# Patient Record
Sex: Female | Born: 1940 | Race: Black or African American | Hispanic: No | Marital: Married | Smoking: Never smoker
Health system: Southern US, Community
[De-identification: ages and names within clinical notes are randomized; demographics above are authoritative.]

## PROBLEM LIST (undated history)

## (undated) DIAGNOSIS — E785 Hyperlipidemia, unspecified: Secondary | ICD-10-CM

## (undated) DIAGNOSIS — K219 Gastro-esophageal reflux disease without esophagitis: Secondary | ICD-10-CM

## (undated) DIAGNOSIS — I10 Essential (primary) hypertension: Secondary | ICD-10-CM

## (undated) HISTORY — PX: CHOLECYSTECTOMY: SHX55

## (undated) HISTORY — PX: KNEE SURGERY: SHX244

## (undated) HISTORY — PX: ABDOMINAL HYSTERECTOMY: SHX81

---

## 2007-03-12 ENCOUNTER — Inpatient Hospital Stay: Payer: Self-pay | Admitting: Internal Medicine

## 2007-03-12 ENCOUNTER — Other Ambulatory Visit: Payer: Self-pay

## 2007-06-26 ENCOUNTER — Ambulatory Visit: Payer: Self-pay | Admitting: Gastroenterology

## 2007-06-27 ENCOUNTER — Emergency Department: Payer: Self-pay | Admitting: Emergency Medicine

## 2007-06-27 ENCOUNTER — Other Ambulatory Visit: Payer: Self-pay

## 2009-01-23 ENCOUNTER — Emergency Department: Payer: Self-pay | Admitting: Emergency Medicine

## 2011-07-07 ENCOUNTER — Emergency Department: Payer: Self-pay | Admitting: Emergency Medicine

## 2011-07-22 ENCOUNTER — Inpatient Hospital Stay: Payer: Self-pay | Admitting: *Deleted

## 2011-08-24 ENCOUNTER — Ambulatory Visit: Payer: Self-pay | Admitting: Gastroenterology

## 2012-04-10 ENCOUNTER — Ambulatory Visit: Payer: Self-pay | Admitting: Internal Medicine

## 2012-04-18 ENCOUNTER — Ambulatory Visit: Payer: Self-pay | Admitting: Internal Medicine

## 2013-02-20 ENCOUNTER — Emergency Department: Payer: Self-pay | Admitting: Emergency Medicine

## 2013-02-20 LAB — COMPREHENSIVE METABOLIC PANEL
Albumin: 4.5 g/dL (ref 3.4–5.0)
BUN: 22 mg/dL — ABNORMAL HIGH (ref 7–18)
Bilirubin,Total: 0.4 mg/dL (ref 0.2–1.0)
Calcium, Total: 10.1 mg/dL (ref 8.5–10.1)
Chloride: 105 mmol/L (ref 98–107)
Creatinine: 1.12 mg/dL (ref 0.60–1.30)
EGFR (Non-African Amer.): 50 — ABNORMAL LOW
Potassium: 3.2 mmol/L — ABNORMAL LOW (ref 3.5–5.1)
SGPT (ALT): 32 U/L (ref 12–78)
Sodium: 139 mmol/L (ref 136–145)
Total Protein: 8.6 g/dL — ABNORMAL HIGH (ref 6.4–8.2)

## 2013-02-20 LAB — TROPONIN I: Troponin-I: <0.02 ng/mL

## 2013-02-20 LAB — CBC
HCT: 41.4 % (ref 35.0–47.0)
HGB: 14 g/dL (ref 12.0–16.0)
MCH: 30 pg (ref 26.0–34.0)
MCHC: 33.8 g/dL (ref 32.0–36.0)
MCV: 89 fL (ref 80–100)
Platelet: 363 10*3/uL (ref 150–440)
RBC: 4.67 10*6/uL (ref 3.80–5.20)
RDW: 13.3 % (ref 11.5–14.5)
WBC: 11.9 10*3/uL — ABNORMAL HIGH (ref 3.6–11.0)

## 2013-02-20 LAB — LIPASE, BLOOD: Lipase: 198 U/L (ref 73–393)

## 2013-10-28 ENCOUNTER — Ambulatory Visit: Payer: Self-pay | Admitting: Internal Medicine

## 2014-12-07 NOTE — Discharge Summary (Signed)
PATIENT NAME:  Deborah Hurst, Deborah Hurst MR#:  [id number] DATE OF BIRTH:  Jul 09, 1941  DATE OF ADMISSION:  07/22/2011 DATE OF DISCHARGE:  07/25/2011  DISCHARGE DIAGNOSES:  1. Acute pancreatitis, now resolved. 2. Asthma exacerbation.  3. Elevated liver function tests, improved, may be secondary to fatty liver.  4. Hypertension.  5. Hypokalemia, resolved.   CONSULTS: GI, Dr. Ashley Jacobs COURSE: This is a 74 year old female who has a history of hypertension and asthma. She presented with nausea, vomiting, and cough, unable to tolerate p.o. She was having high-grade fevers also.  She was admitted as acute pancreatitis with elevated liver function tests. When she came in her lipase was 1947.  She had elevated liver function tests with ALT of 584 and AST of 844. She was hypokalemic with a potassium of 3.2. Creatinine was 1.22.  Her triglycerides were normal at 134. She was admitted as acute pancreatitis and elevated liver function tests. She had a CT of the abdomen done in the Emergency Room that showed mild intrahepatic biliary distention, small left adrenal lesion. Tiny adenoma cannot be excluded. Chest x-ray was essentially negative. She had an ultrasound of the abdomen done which showed that the patient probably has fatty infiltration of the liver status post cholecystectomy. Pancreas is not visualized. She was started on IV hydration, n.p.o.  She was also started on IV Zosyn because of her fever spikes and acute pancreatitis.  She improved with IV hydration, n.p.o. Her lipase improved to 188 on 07/24/2011.  Her liver function tests have been improving as well. Her liver function at discharge: ALT 139 with AST of 60 with normal alkaline phosphatase and normal bilirubin. She had a hepatitis profile done which was negative. Hepatitis A antibody negative, hepatitis B surface antigen negative, hepatitis B core antibody negative. Influenza A and B negative. Acetaminophen level less than 2. Her urinalysis was  negative for any pyuria. Blood and urine cultures were negative. Her INR was 0.9 when she came in. When she came in her white count was slightly elevated at 15.3 but that improved the next day to 7.5. We will give her a total duration of seven days of antibiotics. She was wheezing and short of breath and coughing. She had an asthma exacerbation also.  She was started on a prednisone taper and Duo-Nebs in the hospital.  She was also started on Advair.  I will give her prescriptions for Advair, albuterol nebulizers and a prednisone taper at home. She was hypokalemic, which has resolved right now. I will decrease her hydrochlorothiazide to 25 mg once a day instead of twice a day because of her hypokalemia.  Her lipid profile shows that her LDL is 168.   I will not start on any statin at this time because of her elevated liver function tests.  If her liver function tests improve she may need a statin. With Duo-Nebs and prednisone her asthma significantly improved. Her wheezing has improved.   DISCHARGE MEDICATIONS: 1. Home medications: 2. Losartan 100 mg daily.  3. Tylenol as needed.  4. Ventolin 2 puffs as needed.  5. Advair Diskus 250/50, 1 puff b.i.d.  6. Change hydrochlorothiazide to 25 mg p.o. once daily.  7. Albuterol nebulizers 2.5 mg/ 3mL q. 6 hours as needed for shortness of breath or wheezing when not using a Ventolin inhaler. 8. Augmentin 875 mg p.o. b.i.d. for three days and prednisone taper.  DIET:  Advised a low sodium, low fat, low cholesterol diet.   CONDITION AT DISCHARGE: Comfortable.  T-max 98.6, heart rate 93, blood pressure 132/61, saturating 94% on room air. Chest is almost clear. Heart sounds are regular. Abdomen soft, nontender. Tolerating diet. We will make an appointment with primary care in one week [facility]. Follow up basic metabolic panel and liver function tests at primary care physician's office.  [facility],  Dr. Bluford Kaufmannh, in one month for possible evaluation for colonoscopy. Her magnesium level was normal and her alcohol level was less than 3.   TIME SPENT ON DISCHARGE:  45 minutes.  ____________________________ Fredia SorrowAbhinav Haani Bakula, MD ag:bjt D: 07/25/2011 12:43:11 ET T: 07/25/2011 13:01:35 ET JOB#: [id number]  cc: Fredia SorrowAbhinav Livy Ross, MD, <Dictator> Silas FloodSheikh A. Ellsworth Lennoxejan-Sie, MD  Fredia SorrowABHINAV Donalda Job MD ELECTRONICALLY SIGNED 08/25/2011 15:22

## 2014-12-07 NOTE — Discharge Summary (Signed)
PATIENT NAME:  Deborah Hurst, Deborah Hurst MR#:  [id number] DATE OF BIRTH:  13-Dec-1940  DATE OF ADMISSION:  07/22/2011 DATE OF DISCHARGE:  07/25/2011  ADDENDUM:  Her primary care physician has been set up with Dr. Bluford MainSheikh Tejan-Sie.  ____________________________ Fredia SorrowAbhinav Katie Moch, MD ag:slb D: 07/25/2011 12:59:01 ET T: 07/25/2011 13:05:17 ET JOB#: [id number]  cc: Fredia SorrowAbhinav Deundra Furber, MD, <Dictator> Fredia SorrowABHINAV Magdalene Tardiff MD ELECTRONICALLY SIGNED 08/25/2011 15:22

## 2016-04-26 ENCOUNTER — Other Ambulatory Visit: Payer: Self-pay | Admitting: Internal Medicine

## 2016-04-26 DIAGNOSIS — Z1231 Encounter for screening mammogram for malignant neoplasm of breast: Secondary | ICD-10-CM

## 2016-05-10 ENCOUNTER — Ambulatory Visit: Payer: Self-pay | Attending: Internal Medicine

## 2016-11-17 ENCOUNTER — Other Ambulatory Visit: Payer: Self-pay | Admitting: Internal Medicine

## 2016-11-17 DIAGNOSIS — Z1231 Encounter for screening mammogram for malignant neoplasm of breast: Secondary | ICD-10-CM

## 2016-12-13 ENCOUNTER — Ambulatory Visit
Admission: RE | Admit: 2016-12-13 | Discharge: 2016-12-13 | Disposition: A | Discharge status: Home / Self Care | Payer: Medicare HMO | Source: Ambulatory Visit | Attending: Internal Medicine | Admitting: Internal Medicine

## 2016-12-13 ENCOUNTER — Encounter (HOSPITAL_COMMUNITY): Payer: Self-pay

## 2016-12-13 DIAGNOSIS — Z1231 Encounter for screening mammogram for malignant neoplasm of breast: Secondary | ICD-10-CM | POA: Diagnosis present

## 2019-06-08 ENCOUNTER — Ambulatory Visit (INDEPENDENT_AMBULATORY_CARE_PROVIDER_SITE_OTHER): Payer: Medicare HMO

## 2019-06-08 ENCOUNTER — Ambulatory Visit
Admission: EM | Admit: 2019-06-08 | Discharge: 2019-06-08 | Disposition: A | Discharge status: Home / Self Care | Payer: Medicare HMO | Attending: Emergency Medicine | Admitting: Emergency Medicine

## 2019-06-08 ENCOUNTER — Encounter: Payer: Self-pay | Admitting: Emergency Medicine

## 2019-06-08 ENCOUNTER — Other Ambulatory Visit: Payer: Self-pay

## 2019-06-08 DIAGNOSIS — M25511 Pain in right shoulder: Secondary | ICD-10-CM

## 2019-06-08 DIAGNOSIS — M7531 Calcific tendinitis of right shoulder: Secondary | ICD-10-CM

## 2019-06-08 HISTORY — DX: Essential (primary) hypertension: I10

## 2019-06-08 HISTORY — DX: Gastro-esophageal reflux disease without esophagitis: K21.9

## 2019-06-08 HISTORY — DX: Hyperlipidemia, unspecified: E78.5

## 2019-06-08 MED ORDER — NAPROXEN 375 MG PO TABS: 375.0000 mg | ORAL_TABLET | Freq: Two times a day (BID) | ORAL | 0 refills | Status: DC

## 2019-06-08 NOTE — ED Provider Notes (Signed)
[facility]    CSN: [id number] Arrival date & time: 06/08/19  1002      History   Chief Complaint Chief Complaint  Patient presents with   Arm Pain    right   Arm Swelling    HPI Deborah Hurst is a 78 y.o. female.   HPI  78 year old female presents with right dominant shoulder pain she has noticed for 2 months.  States that it hurts the most just lateral to the acromion and slightly inferior.  It bothers her to move her arm particularly into AB duction as well as internal rotation and extension.  She has no known injury but states that she is very active with the doing housework and uses the arm all the time.  Rest of really worsened over the 69-month period.  States she has an appointment with her PCP on 06/19/2019.  She has no neck pain and has no radiation of pain into her hand.  She has noticed swelling of her arm and hand.        Past Medical History:  Diagnosis Date   GERD (gastroesophageal reflux disease)    Hyperlipidemia    Hypertension     There are no active problems to display for this patient.   Past Surgical History:  Procedure Laterality Date   ABDOMINAL HYSTERECTOMY     CHOLECYSTECTOMY     KNEE SURGERY Right     OB History   No obstetric history on file.      Home Medications    Prior to Admission medications   Medication Sig Start Date End Date Taking? Authorizing Provider  amLODipine-benazepril (LOTREL) 10-20 MG capsule Take by mouth.   Yes [provider]  chlorthalidone (HYGROTON) 25 MG tablet Take 25 mg by mouth daily. 06/07/19  Yes [provider]  cloNIDine (CATAPRES) 0.1 MG tablet Take by mouth.   Yes [provider]  pantoprazole (PROTONIX) 40 MG tablet Take by mouth.   Yes [provider]  polyethylene glycol powder (GLYCOLAX/MIRALAX) 17 GM/SCOOP powder Take by mouth. 02/02/16  Yes [provider]  rosuvastatin (CRESTOR) 5 MG tablet Take 5 mg by mouth daily.  06/07/19  Yes [provider]  naproxen (NAPROSYN) 375 MG tablet Take 1 tablet (375 mg total) by mouth 2 (two) times daily. 06/08/19   Lorin Picket, PA-C    Family History Family History  Problem Relation Age of Onset   Heart attack Mother 96   Prostate cancer Father    Breast cancer Neg Hx     Social History Social History   Tobacco Use   Smoking status: Never Smoker   Smokeless tobacco: Never Used  Substance Use Topics   Alcohol use: Never    Frequency: Never   Drug use: Never     Allergies   Patient has no known allergies.   Review of Systems Review of Systems  Constitutional: Positive for activity change. Negative for appetite change, chills, fatigue and fever.  Musculoskeletal: Positive for arthralgias and myalgias. Negative for gait problem, joint swelling, neck pain and neck stiffness.  All other systems reviewed and are negative.    Physical Exam Triage Vital Signs ED Triage Vitals [06/08/19 1017]  Enc Vitals Group     BP (!) 155/107     Pulse Rate 74     Resp 18     Temp 98.8 F (37.1 C)     Temp Source Oral     SpO2 99 %  Weight 180 lb (81.6 kg)     Height 5\' 2"  (1.575 m)     Head Circumference      Peak Flow      Pain Score 10     Pain Loc      Pain Edu?      Excl. in GC?    No data found.  Updated Vital Signs BP (!) 155/107 (BP Location: Left Arm) Comment: patient has not taken HTN meds today   Pulse 74    Temp 98.8 F (37.1 C) (Oral)    Resp 18    Ht 5\' 2"  (1.575 m)    Wt 180 lb (81.6 kg)    SpO2 99%    BMI 32.92 kg/m   Visual Acuity Right Eye Distance:   Left Eye Distance:   Bilateral Distance:    Right Eye Near:   Left Eye Near:    Bilateral Near:     Physical Exam Vitals signs and nursing note reviewed.  Constitutional:      General: She is not in acute distress.    Appearance: Normal appearance. She is not ill-appearing, toxic-appearing or diaphoretic.  HENT:     Head: Normocephalic and atraumatic.   Eyes:     Conjunctiva/sclera: Conjunctivae normal.  Neck:     Musculoskeletal: Normal range of motion and neck supple.  Cardiovascular:     Rate and Rhythm: Normal rate and regular rhythm.     Heart sounds: Normal heart sounds.  Pulmonary:     Effort: Pulmonary effort is normal.     Breath sounds: Normal breath sounds.  Musculoskeletal:        General: Tenderness present.     Comments: Examination of the right dominant shoulder shows external rotation to 30 degrees internal rotation 90 degrees adduction to 50 degrees.  Maximum tenderness is over the subacromial area.  She has no neck wrist range of motion or pain.  There is no tenderness of the trapezii.Marland Kitchen.  She has no clavicular tenderness.  There is no tenderness of the AC joint.  She has a arm raise test that she is able to only raise her arm to the level of the shoulder on the right full on the left.  She has a negative empty can test on the left and right.  Skin:    General: Skin is warm and dry.  Neurological:     General: No focal deficit present.     Mental Status: She is alert and oriented to person, place, and time.  Psychiatric:        Mood and Affect: Mood normal.        Behavior: Behavior normal.        Thought Content: Thought content normal.        Judgment: Judgment normal.      UC Treatments / Results  Labs (all labs ordered are listed, but only abnormal results are displayed) Labs Reviewed - No data to display  EKG   Radiology Dg Shoulder Right  Result Date: 06/08/2019 CLINICAL DATA:  Acute on chronic right shoulder pain.  No injury. EXAM: RIGHT SHOULDER - 2+ VIEW COMPARISON:  None. FINDINGS: No acute fracture or dislocation. Moderate acromioclavicular joint space narrowing with marginal osteophytes. Small inferior glenoid osteophyte with preserved glenohumeral joint space. Calcifications near the greater tuberosity. Bone mineralization is normal. Soft tissues are unremarkable. IMPRESSION: 1.  No acute osseous  abnormality. 2. Mild glenohumeral and moderate acromioclavicular degenerative changes. 3. Suspected calcific tendinitis. Electronically Signed  By: Obie Dredge M.D.   On: 06/08/2019 11:30    Procedures Procedures (including critical care time)  Medications Ordered in UC Medications - No data to display  Initial Impression / Assessment and Plan / UC Course  I have reviewed the triage vital signs and the nursing notes.  Pertinent labs & imaging results that were available during my care of the patient were reviewed by me and considered in my medical decision making (see chart for details).   78 year old female presents with right shoulder pain.  X-rays today revealed a calcific tendinitis.  Treat her with the Naprosyn 375 mg twice daily  with food.  She was instructed in pendulum exercises to prevent any further decrease in her range of motion.  Recommend she follow-up with an orthopedic surgeon in 1 to 2 weeks.   Final Clinical Impressions(s) / UC Diagnoses   Final diagnoses:  Calcific tendonitis of right shoulder     Discharge Instructions     Take Naprosyn with food.  Follow-up in 1 to 2 weeks with orthopedic surgery.  Perform pendulum exercises that was demonstrated for you 1 to 2 minutes 3-4 times daily every day.  Please read the enclosed instructions    ED Prescriptions    Medication Sig Dispense Auth. Provider   naproxen (NAPROSYN) 375 MG tablet Take 1 tablet (375 mg total) by mouth 2 (two) times daily. 20 tablet Lutricia Feil, PA-C     PDMP not reviewed this encounter.   Lutricia Feil, PA-C 06/08/19 1156

## 2019-06-08 NOTE — Discharge Instructions (Addendum)
Take Naprosyn with food.  Follow-up in 1 to 2 weeks with orthopedic surgery.  Perform pendulum exercises that was demonstrated for you 1 to 2 minutes 3-4 times daily every day.  Please read the enclosed instructions

## 2019-06-08 NOTE — ED Triage Notes (Signed)
Patient in today c/o right shoulder/arm pain and swelling x 2 months. Patient states she has appointment with her PCP on 06/19/19 about same. No injury noted.

## 2019-11-06 ENCOUNTER — Emergency Department
Admission: EM | Admit: 2019-11-06 | Discharge: 2019-11-06 | Disposition: A | Discharge status: Home / Self Care | Payer: Medicare HMO | Attending: Emergency Medicine | Admitting: Emergency Medicine

## 2019-11-06 ENCOUNTER — Encounter: Payer: Self-pay | Admitting: Emergency Medicine

## 2019-11-06 ENCOUNTER — Other Ambulatory Visit: Payer: Self-pay

## 2019-11-06 ENCOUNTER — Emergency Department: Payer: Medicare HMO

## 2019-11-06 DIAGNOSIS — K219 Gastro-esophageal reflux disease without esophagitis: Secondary | ICD-10-CM | POA: Diagnosis not present

## 2019-11-06 DIAGNOSIS — R072 Precordial pain: Secondary | ICD-10-CM | POA: Diagnosis present

## 2019-11-06 DIAGNOSIS — I10 Essential (primary) hypertension: Secondary | ICD-10-CM | POA: Diagnosis not present

## 2019-11-06 DIAGNOSIS — Z79899 Other long term (current) drug therapy: Secondary | ICD-10-CM | POA: Diagnosis not present

## 2019-11-06 DIAGNOSIS — R0789 Other chest pain: Secondary | ICD-10-CM | POA: Diagnosis not present

## 2019-11-06 LAB — BASIC METABOLIC PANEL
Anion gap: 9 (ref 5–15)
BUN: 24 mg/dL — ABNORMAL HIGH (ref 8–23)
CO2: 28 mmol/L (ref 22–32)
Calcium: 9.5 mg/dL (ref 8.9–10.3)
Chloride: 102 mmol/L (ref 98–111)
Creatinine, Ser: 1.32 mg/dL — ABNORMAL HIGH (ref 0.44–1.00)
GFR calc Af Amer: 45 mL/min — ABNORMAL LOW (ref >60)
GFR calc non Af Amer: 39 mL/min — ABNORMAL LOW (ref >60)
Glucose, Bld: 106 mg/dL — ABNORMAL HIGH (ref 70–99)
Potassium: 3.5 mmol/L (ref 3.5–5.1)
Sodium: 139 mmol/L (ref 135–145)

## 2019-11-06 LAB — CBC
HCT: 37.1 % (ref 36.0–46.0)
Hemoglobin: 12.6 g/dL (ref 12.0–15.0)
MCH: 30.4 pg (ref 26.0–34.0)
MCHC: 34 g/dL (ref 30.0–36.0)
MCV: 89.4 fL (ref 80.0–100.0)
Platelets: 342 10*3/uL (ref 150–400)
RBC: 4.15 MIL/uL (ref 3.87–5.11)
RDW: 12.4 % (ref 11.5–15.5)
WBC: 8.1 10*3/uL (ref 4.0–10.5)
nRBC: 0 % (ref 0.0–0.2)

## 2019-11-06 LAB — TROPONIN I (HIGH SENSITIVITY): Troponin I (High Sensitivity): <2 ng/L (ref <18)

## 2019-11-06 MED ORDER — SUCRALFATE 1 G PO TABS: 1.0000 g | ORAL_TABLET | Freq: Four times a day (QID) | ORAL | 1 refills | Status: DC

## 2019-11-06 MED ORDER — FAMOTIDINE 20 MG PO TABS: 20.0000 mg | ORAL_TABLET | Freq: Two times a day (BID) | ORAL | 0 refills | Status: DC

## 2019-11-06 MED ORDER — METOCLOPRAMIDE HCL 10 MG PO TABS
10.0000 mg | ORAL_TABLET | Freq: Once | ORAL | Status: AC
  Administered 2019-11-06: 10 mg via ORAL
  Filled 2019-11-06: qty 1

## 2019-11-06 MED ORDER — ALUM & MAG HYDROXIDE-SIMETH 200-200-20 MG/5ML PO SUSP
30.0000 mL | Freq: Once | ORAL | Status: AC
  Administered 2019-11-06: 30 mL via ORAL
  Filled 2019-11-06: qty 30

## 2019-11-06 MED ORDER — FAMOTIDINE 20 MG PO TABS
40.0000 mg | ORAL_TABLET | Freq: Once | ORAL | Status: AC
  Administered 2019-11-06: 17:00:00 40 mg via ORAL
  Filled 2019-11-06: qty 2

## 2019-11-06 NOTE — ED Provider Notes (Signed)
[facility] Emergency Department Provider Note  ____________________________________________  Time seen: Approximately 4:48 PM  I have reviewed the triage vital signs and the nursing notes.   HISTORY  Chief Complaint Chest Pain    HPI Deborah Hurst is a 79 y.o. female with a history of hypertension hyperlipidemia and GERD who comes to the ED complaining of substernal chest pain described as burning and a "fire" that radiates up to her throat, worse lying down and in the morning, better sitting upright.  Not affected by eating, tried Alka-Seltzer without relief.  Not exertional, not pleuritic.  Waxing and waning for the past 2 days.  Review of electronic medical record shows that she has been prescribed Protonix in the past.  Patient denies taking any H2 blocker or PPI presently.      Past Medical History:  Diagnosis Date  . GERD (gastroesophageal reflux disease)   . Hyperlipidemia   . Hypertension      There are no problems to display for this patient.    Past Surgical History:  Procedure Laterality Date  . ABDOMINAL HYSTERECTOMY    . CHOLECYSTECTOMY    . KNEE SURGERY Right      Prior to Admission medications   Medication Sig Start Date End Date Taking? Authorizing Provider  amLODipine-benazepril (LOTREL) 10-20 MG capsule Take by mouth.    [provider]  chlorthalidone (HYGROTON) 25 MG tablet Take 25 mg by mouth daily. 06/07/19   [provider]  cloNIDine (CATAPRES) 0.1 MG tablet Take by mouth.    [provider]  famotidine (PEPCID) 20 MG tablet Take 1 tablet (20 mg total) by mouth 2 (two) times daily. 11/06/19   Carrie Mew, MD  naproxen (NAPROSYN) 375 MG tablet Take 1 tablet (375 mg total) by mouth 2 (two) times daily. 06/08/19   Lorin Picket, PA-C  pantoprazole (PROTONIX) 40 MG tablet Take by mouth.    [provider]  polyethylene glycol powder (GLYCOLAX/MIRALAX) 17 GM/SCOOP powder  Take by mouth. 02/02/16   [provider]  rosuvastatin (CRESTOR) 5 MG tablet Take 5 mg by mouth daily. 06/07/19   [provider]  sucralfate (CARAFATE) 1 g tablet Take 1 tablet (1 g total) by mouth 4 (four) times daily. 11/06/19   Carrie Mew, MD     Allergies Patient has no known allergies.   Family History  Problem Relation Age of Onset  . Heart attack Mother 87  . Prostate cancer Father   . Breast cancer Neg Hx     Social History Social History   Tobacco Use  . Smoking status: Never Smoker  . Smokeless tobacco: Never Used  Substance Use Topics  . Alcohol use: Never  . Drug use: Never    Review of Systems  Constitutional:   No fever or chills.  ENT:   No sore throat. No rhinorrhea. Cardiovascular: Positive as above chest pain without syncope. Respiratory:   No dyspnea or cough. Gastrointestinal:   Negative for abdominal pain, vomiting and diarrhea.  Musculoskeletal:   Negative for focal pain or swelling All other systems reviewed and are negative except as documented above in ROS and HPI.  ____________________________________________   PHYSICAL EXAM:  VITAL SIGNS: ED Triage Vitals  Enc Vitals Group     BP 11/06/19 1430 135/75     Pulse Rate 11/06/19 1430 76     Resp 11/06/19 1430 16     Temp 11/06/19 1430 98.4 F (36.9 C)     Temp  Source 11/06/19 1430 Oral     SpO2 11/06/19 1430 99 %     Weight 11/06/19 1431 174 lb (78.9 kg)     Height 11/06/19 1431 5\' 2"  (1.575 m)     Head Circumference --      Peak Flow --      Pain Score 11/06/19 1431 8     Pain Loc --      Pain Edu? --      Excl. in GC? --     Vital signs reviewed, nursing assessments reviewed.   Constitutional:   Alert and oriented. Non-toxic appearance. Eyes:   Conjunctivae are normal. EOMI. PERRL. ENT      Head:   Normocephalic and atraumatic.      Nose:   Normal.      Mouth/Throat:   Moist mucous membranes.  Pharyngeal erythema.      Neck:   No meningismus. Full  ROM. Hematological/Lymphatic/Immunilogical:   No cervical lymphadenopathy. Cardiovascular:   RRR. Symmetric bilateral radial and DP pulses.  No murmurs. Cap refill less than 2 seconds. Respiratory:   Normal respiratory effort without tachypnea/retractions. Breath sounds are clear and equal bilaterally. No wheezes/rales/rhonchi. Gastrointestinal:   Soft with mild left upper quadrant tenderness. Non distended. There is no CVA tenderness.  No rebound, rigidity, or guarding. Musculoskeletal:   Normal range of motion in all extremities. No joint effusions.  No lower extremity tenderness.  No edema. Neurologic:   Normal speech and language.  Motor grossly intact. No acute focal neurologic deficits are appreciated.  Skin:    Skin is warm, dry and intact. No rash noted.  No petechiae, purpura, or bullae.  ____________________________________________    LABS (pertinent positives/negatives) (all labs ordered are listed, but only abnormal results are displayed) Labs Reviewed  BASIC METABOLIC PANEL - Abnormal; Notable for the following components:      Result Value   Glucose, Bld 106 (*)    BUN 24 (*)    Creatinine, Ser 1.32 (*)    GFR calc non Af Amer 39 (*)    GFR calc Af Amer 45 (*)    All other components within normal limits  CBC  TROPONIN I (HIGH SENSITIVITY)   ____________________________________________   EKG  Interpreted by me Normal sinus rhythm rate of 75, normal axis and intervals.  Poor R wave progression.  Normal ST segments and T waves.  No ischemic changes.  ____________________________________________    RADIOLOGY  DG Chest 2 View  Result Date: 11/06/2019 CLINICAL DATA:  Substernal chest pain, nausea for 2 days EXAM: CHEST - 2 VIEW COMPARISON:  07/22/2011 FINDINGS: The heart size and mediastinal contours are within normal limits. Both lungs are clear. The visualized skeletal structures are unremarkable. IMPRESSION: No active cardiopulmonary disease. Electronically  Signed   By: 14/02/2011 M.D.   On: 11/06/2019 15:04    ____________________________________________   PROCEDURES Procedures  ____________________________________________    CLINICAL IMPRESSION / ASSESSMENT AND PLAN / ED COURSE  Medications ordered in the ED: Medications  alum & mag hydroxide-simeth (MAALOX/MYLANTA) 200-200-20 MG/5ML suspension 30 mL (has no administration in time range)  famotidine (PEPCID) tablet 40 mg (has no administration in time range)  metoCLOPramide (REGLAN) tablet 10 mg (has no administration in time range)    Pertinent labs & imaging results that were available during my care of the patient were reviewed by me and considered in my medical decision making (see chart for details).  Deborah Hurst was evaluated in Emergency Department on 11/06/2019  for the symptoms described in the history of present illness. She was evaluated in the context of the global COVID-19 pandemic, which necessitated consideration that the patient might be at risk for infection with the SARS-CoV-2 virus that causes COVID-19. Institutional protocols and algorithms that pertain to the evaluation of patients at risk for COVID-19 are in a state of rapid change based on information released by regulatory bodies including the CDC and federal and state organizations. These policies and algorithms were followed during the patient's care in the ED.   Patient presents with atypical chest pain.  Considering the patient's symptoms, medical history, and physical examination today, I have low suspicion for ACS, PE, TAD, pneumothorax, carditis, mediastinitis, pneumonia, CHF, or sepsis.  Most likely GERD/gastritis, as history and exam are highly consistent with this.  Vital signs are normal, chest x-ray and EKG are unremarkable.  Labs unremarkable.  Troponin was sent as part of triage protocol, I do not think cardiac work-up is necessary, would not repeat this.  It was totally negative.  We will  treat with Pepcid, Reglan, Maalox in the ED, prescribed Carafate and Pepcid to take at home, recommended she take these continuously, follow-up with primary care.      ____________________________________________   FINAL CLINICAL IMPRESSION(S) / ED DIAGNOSES    Final diagnoses:  Atypical chest pain  Gastroesophageal reflux disease without esophagitis     ED Discharge Orders         Ordered    sucralfate (CARAFATE) 1 g tablet  4 times daily     11/06/19 1648    famotidine (PEPCID) 20 MG tablet  2 times daily     11/06/19 1648          Portions of this note were generated with dragon dictation software. Dictation errors may occur despite best attempts at proofreading.   Sharman Cheek, MD 11/06/19 [phone]

## 2019-11-06 NOTE — ED Triage Notes (Signed)
Pt c/o substernal chest pain with nausea for the past 2 days. Denies SOB, pt is in NAD. Respirations WNL, skin is warm and dry. Pt ambulatory with a steady gait.

## 2020-06-08 ENCOUNTER — Emergency Department: Payer: Medicare HMO

## 2020-06-08 ENCOUNTER — Encounter: Payer: Self-pay | Admitting: Emergency Medicine

## 2020-06-08 ENCOUNTER — Other Ambulatory Visit: Payer: Self-pay

## 2020-06-08 ENCOUNTER — Emergency Department
Admission: EM | Admit: 2020-06-08 | Discharge: 2020-06-08 | Disposition: A | Discharge status: Home / Self Care | Payer: Medicare HMO | Attending: Student in an Organized Health Care Education/Training Program | Admitting: Student in an Organized Health Care Education/Training Program

## 2020-06-08 DIAGNOSIS — R55 Syncope and collapse: Secondary | ICD-10-CM

## 2020-06-08 DIAGNOSIS — Z79899 Other long term (current) drug therapy: Secondary | ICD-10-CM | POA: Insufficient documentation

## 2020-06-08 DIAGNOSIS — R0981 Nasal congestion: Secondary | ICD-10-CM | POA: Diagnosis not present

## 2020-06-08 DIAGNOSIS — R112 Nausea with vomiting, unspecified: Secondary | ICD-10-CM

## 2020-06-08 DIAGNOSIS — R059 Cough, unspecified: Secondary | ICD-10-CM | POA: Diagnosis not present

## 2020-06-08 DIAGNOSIS — R1031 Right lower quadrant pain: Secondary | ICD-10-CM

## 2020-06-08 DIAGNOSIS — I10 Essential (primary) hypertension: Secondary | ICD-10-CM | POA: Diagnosis not present

## 2020-06-08 DIAGNOSIS — R519 Headache, unspecified: Secondary | ICD-10-CM | POA: Diagnosis not present

## 2020-06-08 DIAGNOSIS — K219 Gastro-esophageal reflux disease without esophagitis: Secondary | ICD-10-CM | POA: Insufficient documentation

## 2020-06-08 LAB — CBC
HCT: 42 % (ref 36.0–46.0)
Hemoglobin: 14.5 g/dL (ref 12.0–15.0)
MCH: 30.8 pg (ref 26.0–34.0)
MCHC: 34.5 g/dL (ref 30.0–36.0)
MCV: 89.2 fL (ref 80.0–100.0)
Platelets: 368 10*3/uL (ref 150–400)
RBC: 4.71 MIL/uL (ref 3.87–5.11)
RDW: 12.5 % (ref 11.5–15.5)
WBC: 12.9 10*3/uL — ABNORMAL HIGH (ref 4.0–10.5)
nRBC: 0 % (ref 0.0–0.2)

## 2020-06-08 LAB — TROPONIN I (HIGH SENSITIVITY)
Troponin I (High Sensitivity): 9 ng/L (ref <18)
Troponin I (High Sensitivity): 11 ng/L (ref <18)

## 2020-06-08 LAB — COMPREHENSIVE METABOLIC PANEL
ALT: 67 U/L — ABNORMAL HIGH (ref 0–44)
AST: 139 U/L — ABNORMAL HIGH (ref 15–41)
Albumin: 4.7 g/dL (ref 3.5–5.0)
Alkaline Phosphatase: 67 U/L (ref 38–126)
Anion gap: 9 (ref 5–15)
BUN: 21 mg/dL (ref 8–23)
CO2: 28 mmol/L (ref 22–32)
Calcium: 10 mg/dL (ref 8.9–10.3)
Chloride: 99 mmol/L (ref 98–111)
Creatinine, Ser: 1.14 mg/dL — ABNORMAL HIGH (ref 0.44–1.00)
GFR, Estimated: 49 mL/min — ABNORMAL LOW (ref >60)
Glucose, Bld: 103 mg/dL — ABNORMAL HIGH (ref 70–99)
Potassium: 3.4 mmol/L — ABNORMAL LOW (ref 3.5–5.1)
Sodium: 136 mmol/L (ref 135–145)
Total Bilirubin: 0.8 mg/dL (ref 0.3–1.2)
Total Protein: 8.1 g/dL (ref 6.5–8.1)

## 2020-06-08 LAB — LIPASE, BLOOD: Lipase: 38 U/L (ref 11–51)

## 2020-06-08 MED ORDER — IOHEXOL 300 MG/ML  SOLN
100.0000 mL | Freq: Once | INTRAMUSCULAR | Status: AC | PRN
  Administered 2020-06-08: 100 mL via INTRAVENOUS

## 2020-06-08 MED ORDER — SODIUM CHLORIDE 0.9 % IV BOLUS
1000.0000 mL | Freq: Once | INTRAVENOUS | Status: AC
  Administered 2020-06-08: 1000 mL via INTRAVENOUS

## 2020-06-08 MED ORDER — ONDANSETRON 4 MG PO TBDP: 4.0000 mg | ORAL_TABLET | Freq: Three times a day (TID) | ORAL | 0 refills | Status: DC | PRN

## 2020-06-08 MED ORDER — ONDANSETRON HCL 4 MG/2ML IJ SOLN
4.0000 mg | Freq: Once | INTRAMUSCULAR | Status: AC
  Administered 2020-06-08: 4 mg via INTRAVENOUS
  Filled 2020-06-08: qty 2

## 2020-06-08 NOTE — ED Triage Notes (Signed)
First Rn note: Pt presents to ED via [facility], per EMS pt took shower this morning, got dizzy, vomited, and fell to the floor. Per EMS on arrival pt found on the floor but conscious. Per EMS pt reports feeling bad and burping/emesis x 2 days. Per EMS pt with hx of HTN.    187->94, negative orthostatics CBG 147

## 2020-06-08 NOTE — ED Provider Notes (Signed)
[facility] Emergency Department Provider Note    First MD Initiated Contact with Patient 06/08/20 1559     (approximate)  I have reviewed the triage vital signs and the nursing notes.   HISTORY  Chief Complaint Emesis and Loss of Consciousness    HPI Deborah Hurst is a 79 y.o. female below listed past medical history presents to the ER for evaluation of 2 days of nausea vomiting right-sided abdominal pain.  She status post cholecystectomy.  Does still have her appendix.  Has been having cough congestion and headache.  Did get vaccinated against Covid has not been around any sick contacts.  After an episode nausea vomiting this morning she went to go take a cold shower and in the shower she felt like she was about to pass out.  EMS was called.  She did not hit her head.  Denies any numbness or tingling.    Past Medical History:  Diagnosis Date  . GERD (gastroesophageal reflux disease)   . Hyperlipidemia   . Hypertension    Family History  Problem Relation Age of Onset  . Heart attack Mother 79  . Prostate cancer Father   . Breast cancer Neg Hx    Past Surgical History:  Procedure Laterality Date  . ABDOMINAL HYSTERECTOMY    . CHOLECYSTECTOMY    . KNEE SURGERY Right    There are no problems to display for this patient.     Prior to Admission medications   Medication Sig Start Date End Date Taking? Authorizing Provider  amLODipine-benazepril (LOTREL) 10-20 MG capsule Take by mouth.    [provider]  chlorthalidone (HYGROTON) 25 MG tablet Take 25 mg by mouth daily. 06/07/19   [provider]  cloNIDine (CATAPRES) 0.1 MG tablet Take by mouth.    [provider]  famotidine (PEPCID) 20 MG tablet Take 1 tablet (20 mg total) by mouth 2 (two) times daily. 11/06/19   Sharman Cheek, MD  naproxen (NAPROSYN) 375 MG tablet Take 1 tablet (375 mg total) by mouth 2 (two) times daily. 06/08/19   Lutricia Feil, PA-C    ondansetron (ZOFRAN ODT) 4 MG disintegrating tablet Take 1 tablet (4 mg total) by mouth every 8 (eight) hours as needed for nausea or vomiting. 06/08/20   Willy Eddy, MD  pantoprazole (PROTONIX) 40 MG tablet Take by mouth.    [provider]  polyethylene glycol powder (GLYCOLAX/MIRALAX) 17 GM/SCOOP powder Take by mouth. 02/02/16   [provider]  rosuvastatin (CRESTOR) 5 MG tablet Take 5 mg by mouth daily. 06/07/19   [provider]  sucralfate (CARAFATE) 1 g tablet Take 1 tablet (1 g total) by mouth 4 (four) times daily. 11/06/19   Sharman Cheek, MD    Allergies Patient has no known allergies.    Social History Social History   Tobacco Use  . Smoking status: Never Smoker  . Smokeless tobacco: Never Used  Vaping Use  . Vaping Use: Never used  Substance Use Topics  . Alcohol use: Never  . Drug use: Never    Review of Systems Patient denies headaches, rhinorrhea, blurry vision, numbness, shortness of breath, chest pain, edema, cough, abdominal pain, nausea, vomiting, diarrhea, dysuria, fevers, rashes or hallucinations unless otherwise stated above in HPI. ____________________________________________   PHYSICAL EXAM:  VITAL SIGNS: Vitals:   06/08/20 1250 06/08/20 1700  BP: (!) 147/69 (!) 155/85  Pulse: 86 81  Resp: 20 18  Temp: 98.8 F (37.1 C)  SpO2: 95% 99%    Constitutional: Alert and oriented.  Eyes: Conjunctivae are normal.  Head: Atraumatic. Nose: No congestion/rhinnorhea. Mouth/Throat: Mucous membranes are moist.   Neck: No stridor. Painless ROM.  Cardiovascular: Normal rate, regular rhythm. Grossly normal heart sounds.  Good peripheral circulation. Respiratory: Normal respiratory effort.  No retractions. Lungs CTAB. Gastrointestinal: Soft with mild right sided ttp no rebound or guarding. No distention. No abdominal bruits. No CVA tenderness. Genitourinary:  Musculoskeletal: No lower extremity tenderness nor edema.  No  joint effusions. Neurologic:  Normal speech and language. No gross focal neurologic deficits are appreciated. No facial droop Skin:  Skin is warm, dry and intact. No rash noted. Psychiatric: Mood and affect are normal. Speech and behavior are normal.  ____________________________________________   LABS (all labs ordered are listed, but only abnormal results are displayed)  Results for orders placed or performed during the hospital encounter of 06/08/20 (from the past 24 hour(s))  Lipase, blood     Status: None   Collection Time: 06/08/20 12:59 PM  Result Value Ref Range   Lipase 38 11 - 51 U/L  Comprehensive metabolic panel     Status: Abnormal   Collection Time: 06/08/20 12:59 PM  Result Value Ref Range   Sodium 136 135 - 145 mmol/L   Potassium 3.4 (L) 3.5 - 5.1 mmol/L   Chloride 99 98 - 111 mmol/L   CO2 28 22 - 32 mmol/L   Glucose, Bld 103 (H) 70 - 99 mg/dL   BUN 21 8 - 23 mg/dL   Creatinine, Ser 2.13 (H) 0.44 - 1.00 mg/dL   Calcium 08.6 8.9 - 57.8 mg/dL   Total Protein 8.1 6.5 - 8.1 g/dL   Albumin 4.7 3.5 - 5.0 g/dL   AST 469 (H) 15 - 41 U/L   ALT 67 (H) 0 - 44 U/L   Alkaline Phosphatase 67 38 - 126 U/L   Total Bilirubin 0.8 0.3 - 1.2 mg/dL   GFR, Estimated 49 (L) >60 mL/min   Anion gap 9 5 - 15  CBC     Status: Abnormal   Collection Time: 06/08/20 12:59 PM  Result Value Ref Range   WBC 12.9 (H) 4.0 - 10.5 K/uL   RBC 4.71 3.87 - 5.11 MIL/uL   Hemoglobin 14.5 12.0 - 15.0 g/dL   HCT 62.9 36 - 46 %   MCV 89.2 80.0 - 100.0 fL   MCH 30.8 26.0 - 34.0 pg   MCHC 34.5 30.0 - 36.0 g/dL   RDW 52.8 41.3 - 24.4 %   Platelets 368 150 - 400 K/uL   nRBC 0.0 0.0 - 0.2 %  Troponin I (High Sensitivity)     Status: None   Collection Time: 06/08/20  5:03 PM  Result Value Ref Range   Troponin I (High Sensitivity) 9 <18 ng/L  Troponin I (High Sensitivity)     Status: None   Collection Time: 06/08/20  7:13 PM  Result Value Ref Range   Troponin I (High Sensitivity) 11 <18 ng/L    ____________________________________________  EKG My review and personal interpretation at Time: 12:52   Indication: near syncope  Rate: 85  Rhythm: sinus Axis: normal Other: normal intervals, poor r wave progression, no stemi ____________________________________________  RADIOLOGY  I personally reviewed all radiographic images ordered to evaluate for the above acute complaints and reviewed radiology reports and findings.  These findings were personally discussed with the patient.  Please see medical record for radiology report.  ____________________________________________   PROCEDURES  Procedure(s) performed:  Procedures    Critical Care performed: no ____________________________________________   INITIAL IMPRESSION / ASSESSMENT AND PLAN / ED COURSE  Pertinent labs & imaging results that were available during my care of the patient were reviewed by me and considered in my medical decision making (see chart for details).   DDX: Dehydration, electrolyte abnormality, appendicitis, SBO, pancreatitis, pneumonia, CHF, dysrhythmia, orthostasis, vasovagal  EDY MCBANE is a 79 y.o. who presents to the ED with presentation as described above.  Patient currently nontoxic-appearing.  Did not hit her head no sign of neuro deficit.  Exam is otherwise reassuring but does have some mild right lower quadrant pain with her associated symptoms possible diverticulitis causing some dehydration possible SBO.  Will give IV fluids antiemetic and reassess.  She is not having significant pain right now.  I will lower suspicion for cardiac etiology.  The patient will be placed on continuous pulse oximetry and telemetry for monitoring.  Laboratory evaluation will be sent to evaluate for the above complaints.     Clinical Course as of Jun 09 2051  Sheral Flow Jun 08, 2020  2018 Ultrasound is without evidence of torsion.  Is concerning for complex cyst or solid mass.  I discussed these findings with the  patient and discussed the importance of close outpatient follow-up.  She is currently feeling well.  IV fluids seem to help.  Troponins are negative.  She is not having any persistent symptoms.  No persistent nausea or vomiting.  Abdominal exam soft benign at this time.  It does appear appropriate for outpatient follow-up   [PR]    Clinical Course User Index [PR] Willy Eddy, MD    The patient was evaluated in Emergency Department today for the symptoms described in the history of present illness. He/she was evaluated in the context of the global COVID-19 pandemic, which necessitated consideration that the patient might be at risk for infection with the SARS-CoV-2 virus that causes COVID-19. Institutional protocols and algorithms that pertain to the evaluation of patients at risk for COVID-19 are in a state of rapid change based on information released by regulatory bodies including the CDC and federal and state organizations. These policies and algorithms were followed during the patient's care in the ED.  As part of my medical decision making, I reviewed the following data within the electronic MEDICAL RECORD NUMBER Nursing notes reviewed and incorporated, Labs reviewed, notes from prior ED visits and [place] Controlled Substance Database   ____________________________________________   FINAL CLINICAL IMPRESSION(S) / ED DIAGNOSES  Final diagnoses:  Right lower quadrant pain  Non-intractable vomiting with nausea, unspecified vomiting type  Near syncope      NEW MEDICATIONS STARTED DURING THIS VISIT:  New Prescriptions   ONDANSETRON (ZOFRAN ODT) 4 MG DISINTEGRATING TABLET    Take 1 tablet (4 mg total) by mouth every 8 (eight) hours as needed for nausea or vomiting.     Note:  This document was prepared using Dragon voice recognition software and may include unintentional dictation errors.    Willy Eddy, MD 06/08/20 2052

## 2020-06-08 NOTE — Discharge Instructions (Signed)
Please follow up with Dr. Dalbert Garnet in [facility] regarding the Ovarian cyst found on imaging today.

## 2021-02-12 ENCOUNTER — Ambulatory Visit
Admission: EM | Admit: 2021-02-12 | Discharge: 2021-02-12 | Disposition: A | Discharge status: Home / Self Care | Payer: Medicare HMO | Attending: Family Medicine | Admitting: Family Medicine

## 2021-02-12 ENCOUNTER — Ambulatory Visit (INDEPENDENT_AMBULATORY_CARE_PROVIDER_SITE_OTHER): Payer: Medicare HMO

## 2021-02-12 ENCOUNTER — Other Ambulatory Visit: Payer: Self-pay

## 2021-02-12 DIAGNOSIS — M1909 Primary osteoarthritis, other specified site: Secondary | ICD-10-CM

## 2021-02-12 DIAGNOSIS — M79644 Pain in right finger(s): Secondary | ICD-10-CM | POA: Diagnosis not present

## 2021-02-12 DIAGNOSIS — M19041 Primary osteoarthritis, right hand: Secondary | ICD-10-CM | POA: Diagnosis not present

## 2021-02-12 MED ORDER — PREDNISONE 10 MG (21) PO TBPK: ORAL_TABLET | ORAL | 0 refills | Status: DC

## 2021-02-12 NOTE — ED Provider Notes (Signed)
[facility]    CSN: [id number] Arrival date & time: 02/12/21  2353      History   Chief Complaint Chief Complaint  Patient presents with   Hand Pain    Right    HPI  80 year old female presents with the above complaint.  Patient reports that she has had ongoing pain of her right for the past week.  She localizes it to the MCP joint.  She is right-handed.  She states that it makes it difficult for her to use her right hand per tickly with opening things.  Pain 7/10 in severity.  Pain worse at night.  She reports some associated numbness of her hand.  Denies fall, trauma, injury.  No relieving factors.  Past Medical History:  Diagnosis Date   GERD (gastroesophageal reflux disease)    Hyperlipidemia    Hypertension    Past Surgical History:  Procedure Laterality Date   ABDOMINAL HYSTERECTOMY     CHOLECYSTECTOMY     KNEE SURGERY Right     OB History   No obstetric history on file.      Home Medications    Prior to Admission medications   Medication Sig Start Date End Date Taking? Authorizing Provider  amLODipine-benazepril (LOTREL) 10-20 MG capsule Take by mouth.   Yes [provider]  carvedilol (COREG) 25 MG tablet Take 25 mg by mouth 2 (two) times daily. 12/17/20  Yes [provider]  chlorthalidone (HYGROTON) 25 MG tablet Take 25 mg by mouth daily. 06/07/19  Yes [provider]  cloNIDine (CATAPRES) 0.1 MG tablet Take by mouth.   Yes [provider]  famotidine (PEPCID) 20 MG tablet Take 1 tablet (20 mg total) by mouth 2 (two) times daily. 11/06/19  Yes Sharman Cheek, MD  LINZESS 72 MCG capsule Take 72 mcg by mouth every morning. 01/01/21  Yes [provider]  pantoprazole (PROTONIX) 40 MG tablet Take by mouth.   Yes [provider]  predniSONE (STERAPRED UNI-PAK 21 TAB) 10 MG (21) TBPK tablet 6 tablets on day 1; decrease by 1 tablet daily until gone. 02/12/21  Yes Ayaan Ringle G, DO  rosuvastatin  (CRESTOR) 5 MG tablet Take 5 mg by mouth daily. 06/07/19  Yes [provider]    Family History Family History  Problem Relation Age of Onset   Heart attack Mother 69   Prostate cancer Father    Breast cancer Neg Hx     Social History Social History   Tobacco Use   Smoking status: Never   Smokeless tobacco: Never  Vaping Use   Vaping Use: Never used  Substance Use Topics   Alcohol use: Never   Drug use: Never     Allergies   Patient has no known allergies.   Review of Systems Review of Systems Per HPI  Physical Exam Triage Vital Signs ED Triage Vitals  Enc Vitals Group     BP 02/12/21 0939 117/70     Pulse Rate 02/12/21 0939 61     Resp 02/12/21 0939 18     Temp 02/12/21 0939 98.8 F (37.1 C)     Temp Source 02/12/21 0939 Oral     SpO2 02/12/21 0939 98 %     Weight 02/12/21 0936 170 lb (77.1 kg)     Height 02/12/21 0936 5\' 3"  (1.6 m)     Head Circumference --      Peak Flow --      Pain Score 02/12/21 0936 7  Pain Loc --      Pain Edu? --      Excl. in GC? --    Updated Vital Signs BP 117/70 (BP Location: Left Arm)   Pulse 61   Temp 98.8 F (37.1 C) (Oral)   Resp 18   Ht 5\' 3"  (1.6 m)   Wt 77.1 kg   SpO2 98%   BMI 30.11 kg/m   Visual Acuity Right Eye Distance:   Left Eye Distance:   Bilateral Distance:    Right Eye Near:   Left Eye Near:    Bilateral Near:     Physical Exam Constitutional:      General: She is not in acute distress.    Appearance: Normal appearance. She is not ill-appearing.  HENT:     Head: Normocephalic and atraumatic.  Eyes:     General:        Right eye: No discharge.        Left eye: No discharge.     Conjunctiva/sclera: Conjunctivae normal.  Pulmonary:     Effort: Pulmonary effort is normal. No respiratory distress.  Musculoskeletal:     Comments: Right thumb -tenderness over the MCP joint.  Palpable firm area that is likely from an osteophyte.  Neurological:     Mental Status: She is alert.   Psychiatric:        Mood and Affect: Mood normal.        Behavior: Behavior normal.    UC Treatments / Results  Labs (all labs ordered are listed, but only abnormal results are displayed) Labs Reviewed - No data to display  EKG   Radiology DG Finger Thumb Right  Result Date: 02/12/2021 CLINICAL DATA:  Pain at MCP joint. EXAM: RIGHT THUMB 2+V COMPARISON:  None. FINDINGS: No evidence of acute fracture or dislocation. 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 Nonspecific calcification in the soft tissues of the wrist. IMPRESSION: 1. No evidence of acute fracture or dislocation. 2. 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 Electronically Signed   By: 04/15/2021 MD   On: 02/12/2021 10:34    Procedures Procedures (including critical care time)  Medications Ordered in UC Medications - No data to display  Initial Impression / Assessment and Plan / UC Course  I have reviewed the triage vital signs and the nursing notes.  Pertinent labs & imaging results that were available during my care of the patient were reviewed by me and considered in my medical decision making (see chart for details).    80 year old female presents with thumb pain.  X-ray obtained today and was independently reviewed by me.  Interpretation: Degenerative changes noted.  Placing on prednisone.  Follow-up with orthopedics.  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  Medication as directed.  Please call [facility] [phone]) for an appt with Dr. ([phone].  Take care  Dr. Stephenie Acres     ED Prescriptions     Medication Sig Dispense Auth. Provider   predniSONE (STERAPRED UNI-PAK 21 TAB) 10 MG (21) TBPK tablet 6 tablets on day 1; decrease by 1 tablet daily until gone. 21 tablet Adriana Simas G, DO      PDMP not reviewed this encounter.   Everlene Other, Tommie Sams 02/12/21 1118

## 2021-02-12 NOTE — ED Triage Notes (Signed)
Patient complains of right hand pain x 1 week. Patient states that pain is mostly at base of thumb and hurts worse at night. Denies any known injury.

## 2021-02-12 NOTE — Discharge Instructions (Addendum)
Rest, ice.  Medication as directed.  Please call [facility] [phone]) for an appt with Dr. Stephenie Acres.  Take care  Dr. Adriana Simas

## 2021-02-19 DIAGNOSIS — G5601 Carpal tunnel syndrome, right upper limb: Secondary | ICD-10-CM | POA: Diagnosis not present

## 2021-02-19 DIAGNOSIS — M189 Osteoarthritis of first carpometacarpal joint, unspecified: Secondary | ICD-10-CM | POA: Diagnosis not present

## 2021-02-23 DIAGNOSIS — J301 Allergic rhinitis due to pollen: Secondary | ICD-10-CM | POA: Diagnosis not present

## 2021-02-23 DIAGNOSIS — F411 Generalized anxiety disorder: Secondary | ICD-10-CM | POA: Diagnosis not present

## 2021-02-23 DIAGNOSIS — E785 Hyperlipidemia, unspecified: Secondary | ICD-10-CM | POA: Diagnosis not present

## 2021-02-23 DIAGNOSIS — I10 Essential (primary) hypertension: Secondary | ICD-10-CM | POA: Diagnosis not present

## 2021-02-23 DIAGNOSIS — N189 Chronic kidney disease, unspecified: Secondary | ICD-10-CM | POA: Diagnosis not present

## 2021-02-23 DIAGNOSIS — N83209 Unspecified ovarian cyst, unspecified side: Secondary | ICD-10-CM | POA: Diagnosis not present

## 2021-02-23 DIAGNOSIS — K219 Gastro-esophageal reflux disease without esophagitis: Secondary | ICD-10-CM | POA: Diagnosis not present

## 2021-02-23 DIAGNOSIS — J45998 Other asthma: Secondary | ICD-10-CM | POA: Diagnosis not present

## 2021-02-23 DIAGNOSIS — K5909 Other constipation: Secondary | ICD-10-CM | POA: Diagnosis not present

## 2021-03-22 DIAGNOSIS — K5909 Other constipation: Secondary | ICD-10-CM | POA: Diagnosis not present

## 2021-03-22 DIAGNOSIS — Z8249 Family history of ischemic heart disease and other diseases of the circulatory system: Secondary | ICD-10-CM | POA: Diagnosis not present

## 2021-03-22 DIAGNOSIS — Z809 Family history of malignant neoplasm, unspecified: Secondary | ICD-10-CM | POA: Diagnosis not present

## 2021-03-22 DIAGNOSIS — J45909 Unspecified asthma, uncomplicated: Secondary | ICD-10-CM | POA: Diagnosis not present

## 2021-03-22 DIAGNOSIS — Z7951 Long term (current) use of inhaled steroids: Secondary | ICD-10-CM | POA: Diagnosis not present

## 2021-03-22 DIAGNOSIS — I10 Essential (primary) hypertension: Secondary | ICD-10-CM | POA: Diagnosis not present

## 2021-03-22 DIAGNOSIS — Z825 Family history of asthma and other chronic lower respiratory diseases: Secondary | ICD-10-CM | POA: Diagnosis not present

## 2021-03-22 DIAGNOSIS — G8929 Other chronic pain: Secondary | ICD-10-CM | POA: Diagnosis not present

## 2021-03-22 DIAGNOSIS — E669 Obesity, unspecified: Secondary | ICD-10-CM | POA: Diagnosis not present

## 2021-03-22 DIAGNOSIS — K219 Gastro-esophageal reflux disease without esophagitis: Secondary | ICD-10-CM | POA: Diagnosis not present

## 2021-03-22 DIAGNOSIS — M792 Neuralgia and neuritis, unspecified: Secondary | ICD-10-CM | POA: Diagnosis not present

## 2021-03-22 DIAGNOSIS — R32 Unspecified urinary incontinence: Secondary | ICD-10-CM | POA: Diagnosis not present

## 2021-03-22 DIAGNOSIS — Z9181 History of falling: Secondary | ICD-10-CM | POA: Diagnosis not present

## 2021-03-22 DIAGNOSIS — M2550 Pain in unspecified joint: Secondary | ICD-10-CM | POA: Diagnosis not present

## 2021-05-24 DIAGNOSIS — E785 Hyperlipidemia, unspecified: Secondary | ICD-10-CM | POA: Diagnosis not present

## 2021-05-26 DIAGNOSIS — I10 Essential (primary) hypertension: Secondary | ICD-10-CM | POA: Diagnosis not present

## 2021-05-26 DIAGNOSIS — J45998 Other asthma: Secondary | ICD-10-CM | POA: Diagnosis not present

## 2021-05-26 DIAGNOSIS — F411 Generalized anxiety disorder: Secondary | ICD-10-CM | POA: Diagnosis not present

## 2021-05-26 DIAGNOSIS — K5909 Other constipation: Secondary | ICD-10-CM | POA: Diagnosis not present

## 2021-05-26 DIAGNOSIS — K219 Gastro-esophageal reflux disease without esophagitis: Secondary | ICD-10-CM | POA: Diagnosis not present

## 2021-05-26 DIAGNOSIS — N189 Chronic kidney disease, unspecified: Secondary | ICD-10-CM | POA: Diagnosis not present

## 2021-05-26 DIAGNOSIS — N83209 Unspecified ovarian cyst, unspecified side: Secondary | ICD-10-CM | POA: Diagnosis not present

## 2021-05-26 DIAGNOSIS — J301 Allergic rhinitis due to pollen: Secondary | ICD-10-CM | POA: Diagnosis not present

## 2021-05-26 DIAGNOSIS — E785 Hyperlipidemia, unspecified: Secondary | ICD-10-CM | POA: Diagnosis not present

## 2021-07-06 DIAGNOSIS — H01009 Unspecified blepharitis unspecified eye, unspecified eyelid: Secondary | ICD-10-CM | POA: Diagnosis not present

## 2021-07-08 IMAGING — CR DG FINGER THUMB 2+V*R*
3 series · 3 of 3 positions shown · non-contrast
Comparison: None.

CLINICAL DATA: Pain at MCP joint.

EXAM:
RIGHT THUMB 2+V

[finger ap]
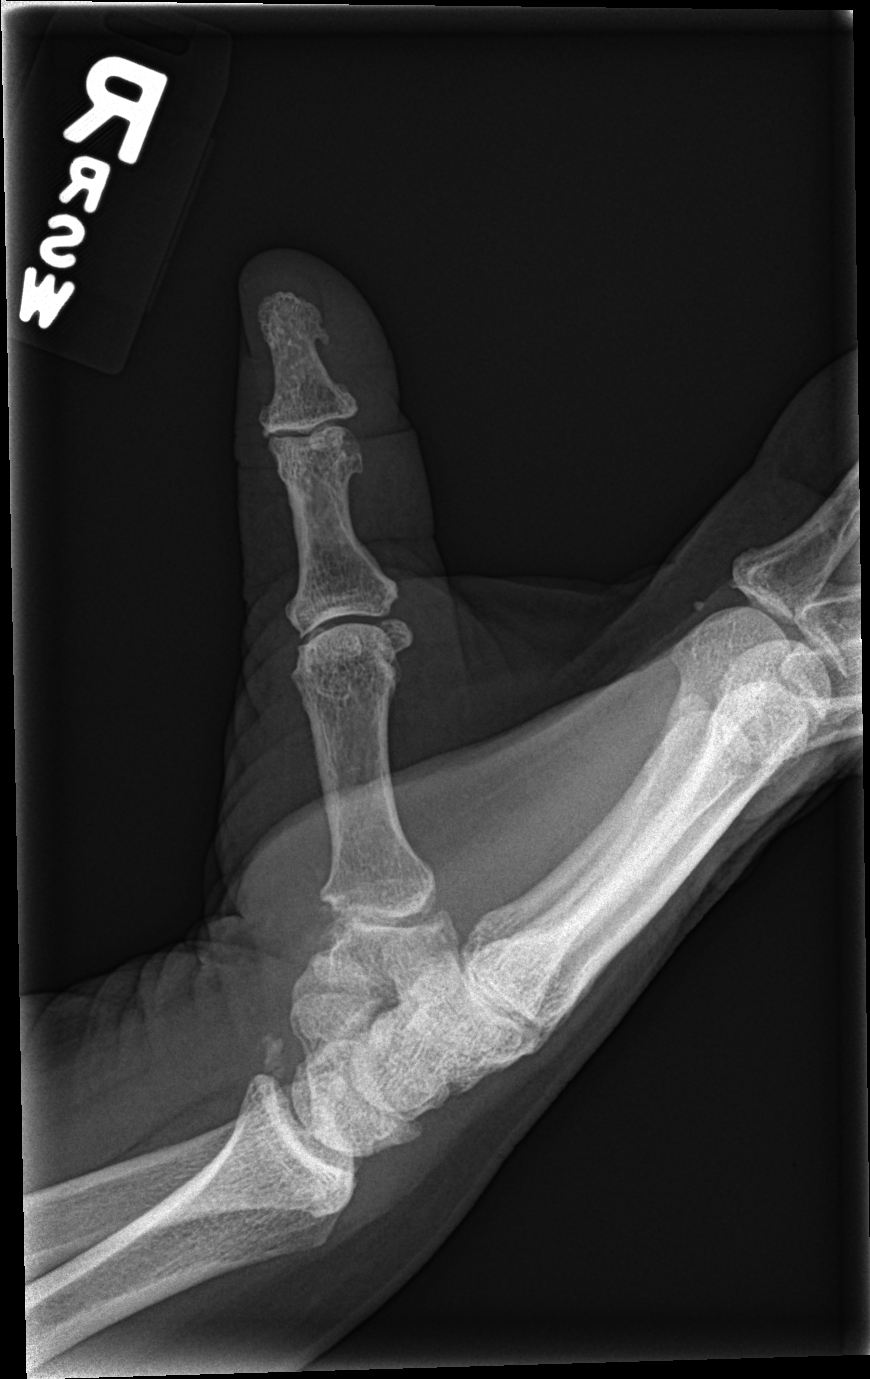

[finger obl]
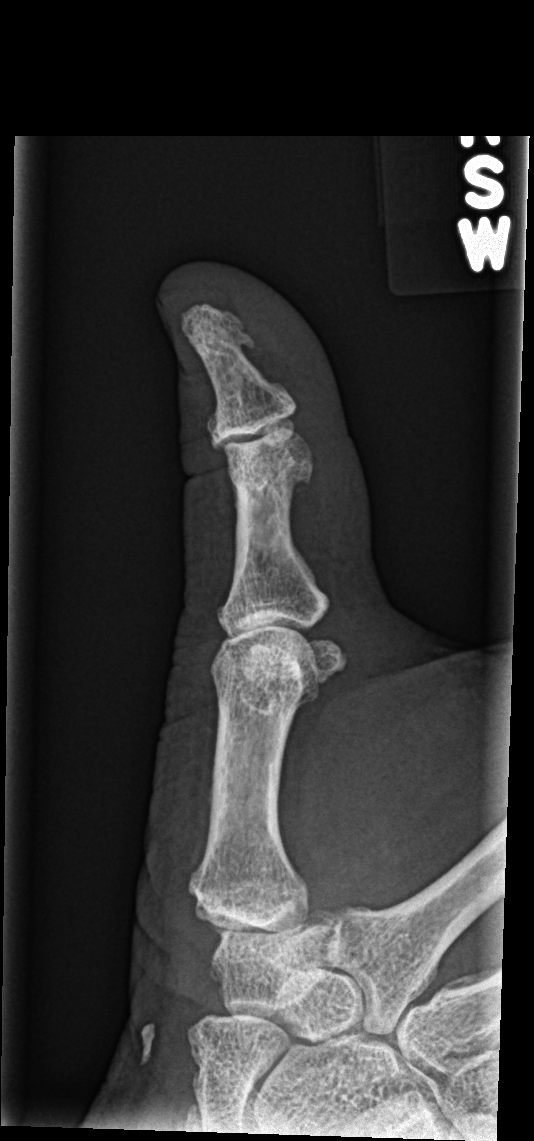

[finger lat]
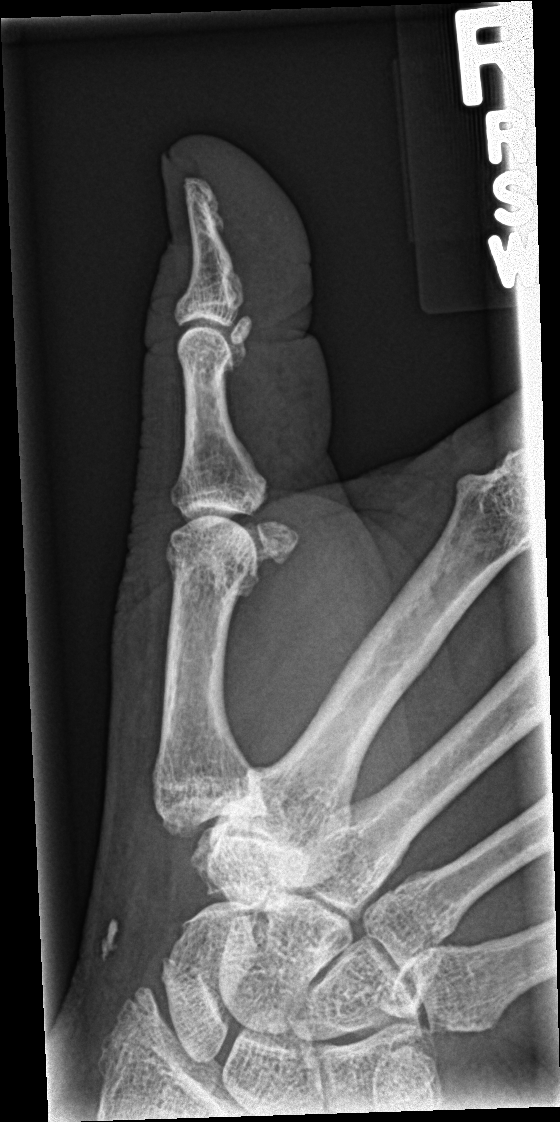

[3 of 3 positions shown; findings below may reference images not displayed]

FINDINGS: No evidence of acute fracture or dislocation. Moderate CMC and mild
MCP joint degenerative change. Nonspecific calcification in the soft
tissues of the wrist.
IMPRESSION: 1. No evidence of acute fracture or dislocation.
2. Moderate CMC and mild MCP joint degenerative change.

## 2021-07-13 DIAGNOSIS — J301 Allergic rhinitis due to pollen: Secondary | ICD-10-CM | POA: Diagnosis not present

## 2021-07-13 DIAGNOSIS — M13811 Other specified arthritis, right shoulder: Secondary | ICD-10-CM | POA: Diagnosis not present

## 2021-07-13 DIAGNOSIS — E785 Hyperlipidemia, unspecified: Secondary | ICD-10-CM | POA: Diagnosis not present

## 2021-07-13 DIAGNOSIS — J069 Acute upper respiratory infection, unspecified: Secondary | ICD-10-CM | POA: Diagnosis not present

## 2021-07-13 DIAGNOSIS — Z0001 Encounter for general adult medical examination with abnormal findings: Secondary | ICD-10-CM | POA: Diagnosis not present

## 2021-07-13 DIAGNOSIS — N189 Chronic kidney disease, unspecified: Secondary | ICD-10-CM | POA: Diagnosis not present

## 2021-07-13 DIAGNOSIS — Z1331 Encounter for screening for depression: Secondary | ICD-10-CM | POA: Diagnosis not present

## 2021-07-13 DIAGNOSIS — I10 Essential (primary) hypertension: Secondary | ICD-10-CM | POA: Diagnosis not present

## 2021-07-13 DIAGNOSIS — M67833 Other specified disorders of tendon, right wrist: Secondary | ICD-10-CM | POA: Diagnosis not present

## 2021-07-13 DIAGNOSIS — K219 Gastro-esophageal reflux disease without esophagitis: Secondary | ICD-10-CM | POA: Diagnosis not present

## 2021-07-13 DIAGNOSIS — N83209 Unspecified ovarian cyst, unspecified side: Secondary | ICD-10-CM | POA: Diagnosis not present

## 2021-07-13 DIAGNOSIS — K5909 Other constipation: Secondary | ICD-10-CM | POA: Diagnosis not present

## 2021-07-13 DIAGNOSIS — F411 Generalized anxiety disorder: Secondary | ICD-10-CM | POA: Diagnosis not present

## 2021-07-13 DIAGNOSIS — J45998 Other asthma: Secondary | ICD-10-CM | POA: Diagnosis not present

## 2021-07-13 DIAGNOSIS — F331 Major depressive disorder, recurrent, moderate: Secondary | ICD-10-CM | POA: Diagnosis not present

## 2021-07-19 DIAGNOSIS — Z20822 Contact with and (suspected) exposure to covid-19: Secondary | ICD-10-CM | POA: Diagnosis not present

## 2021-07-19 DIAGNOSIS — R059 Cough, unspecified: Secondary | ICD-10-CM | POA: Diagnosis not present

## 2021-07-19 DIAGNOSIS — J4531 Mild persistent asthma with (acute) exacerbation: Secondary | ICD-10-CM | POA: Diagnosis not present

## 2021-07-19 DIAGNOSIS — R030 Elevated blood-pressure reading, without diagnosis of hypertension: Secondary | ICD-10-CM | POA: Diagnosis not present

## 2021-08-10 DIAGNOSIS — E559 Vitamin D deficiency, unspecified: Secondary | ICD-10-CM | POA: Diagnosis not present

## 2021-08-10 DIAGNOSIS — N189 Chronic kidney disease, unspecified: Secondary | ICD-10-CM | POA: Diagnosis not present

## 2021-08-24 ENCOUNTER — Other Ambulatory Visit: Payer: Self-pay | Admitting: Internal Medicine

## 2021-08-24 DIAGNOSIS — J45998 Other asthma: Secondary | ICD-10-CM | POA: Diagnosis not present

## 2021-08-24 DIAGNOSIS — E785 Hyperlipidemia, unspecified: Secondary | ICD-10-CM | POA: Diagnosis not present

## 2021-08-24 DIAGNOSIS — J069 Acute upper respiratory infection, unspecified: Secondary | ICD-10-CM | POA: Diagnosis not present

## 2021-08-24 DIAGNOSIS — K5909 Other constipation: Secondary | ICD-10-CM | POA: Diagnosis not present

## 2021-08-24 DIAGNOSIS — G5603 Carpal tunnel syndrome, bilateral upper limbs: Secondary | ICD-10-CM | POA: Diagnosis not present

## 2021-08-24 DIAGNOSIS — K219 Gastro-esophageal reflux disease without esophagitis: Secondary | ICD-10-CM | POA: Diagnosis not present

## 2021-08-24 DIAGNOSIS — J301 Allergic rhinitis due to pollen: Secondary | ICD-10-CM | POA: Diagnosis not present

## 2021-08-24 DIAGNOSIS — Z1231 Encounter for screening mammogram for malignant neoplasm of breast: Secondary | ICD-10-CM

## 2021-08-24 DIAGNOSIS — F411 Generalized anxiety disorder: Secondary | ICD-10-CM | POA: Diagnosis not present

## 2021-08-24 DIAGNOSIS — I10 Essential (primary) hypertension: Secondary | ICD-10-CM | POA: Diagnosis not present

## 2021-09-03 DIAGNOSIS — E785 Hyperlipidemia, unspecified: Secondary | ICD-10-CM | POA: Diagnosis not present

## 2021-09-07 DIAGNOSIS — J301 Allergic rhinitis due to pollen: Secondary | ICD-10-CM | POA: Diagnosis not present

## 2021-09-07 DIAGNOSIS — M13811 Other specified arthritis, right shoulder: Secondary | ICD-10-CM | POA: Diagnosis not present

## 2021-09-07 DIAGNOSIS — E785 Hyperlipidemia, unspecified: Secondary | ICD-10-CM | POA: Diagnosis not present

## 2021-09-07 DIAGNOSIS — K5909 Other constipation: Secondary | ICD-10-CM | POA: Diagnosis not present

## 2021-09-07 DIAGNOSIS — I10 Essential (primary) hypertension: Secondary | ICD-10-CM | POA: Diagnosis not present

## 2021-09-07 DIAGNOSIS — J45998 Other asthma: Secondary | ICD-10-CM | POA: Diagnosis not present

## 2021-09-07 DIAGNOSIS — N189 Chronic kidney disease, unspecified: Secondary | ICD-10-CM | POA: Diagnosis not present

## 2021-09-07 DIAGNOSIS — J069 Acute upper respiratory infection, unspecified: Secondary | ICD-10-CM | POA: Diagnosis not present

## 2021-09-07 DIAGNOSIS — F411 Generalized anxiety disorder: Secondary | ICD-10-CM | POA: Diagnosis not present

## 2021-10-05 ENCOUNTER — Other Ambulatory Visit: Payer: Self-pay

## 2021-10-05 ENCOUNTER — Ambulatory Visit
Admission: RE | Admit: 2021-10-05 | Discharge: 2021-10-05 | Disposition: A | Discharge status: Home / Self Care | Payer: Medicare PPO | Source: Ambulatory Visit | Attending: Internal Medicine | Admitting: Internal Medicine

## 2021-10-05 DIAGNOSIS — Z1231 Encounter for screening mammogram for malignant neoplasm of breast: Secondary | ICD-10-CM | POA: Insufficient documentation

## 2021-11-12 DIAGNOSIS — N189 Chronic kidney disease, unspecified: Secondary | ICD-10-CM | POA: Diagnosis not present

## 2021-11-12 DIAGNOSIS — E785 Hyperlipidemia, unspecified: Secondary | ICD-10-CM | POA: Diagnosis not present

## 2021-11-16 DIAGNOSIS — K5909 Other constipation: Secondary | ICD-10-CM | POA: Diagnosis not present

## 2021-11-16 DIAGNOSIS — F411 Generalized anxiety disorder: Secondary | ICD-10-CM | POA: Diagnosis not present

## 2021-11-16 DIAGNOSIS — N189 Chronic kidney disease, unspecified: Secondary | ICD-10-CM | POA: Diagnosis not present

## 2021-11-16 DIAGNOSIS — I10 Essential (primary) hypertension: Secondary | ICD-10-CM | POA: Diagnosis not present

## 2021-11-16 DIAGNOSIS — E785 Hyperlipidemia, unspecified: Secondary | ICD-10-CM | POA: Diagnosis not present

## 2021-11-16 DIAGNOSIS — R7303 Prediabetes: Secondary | ICD-10-CM | POA: Diagnosis not present

## 2021-11-16 DIAGNOSIS — M13811 Other specified arthritis, right shoulder: Secondary | ICD-10-CM | POA: Diagnosis not present

## 2021-11-16 DIAGNOSIS — J301 Allergic rhinitis due to pollen: Secondary | ICD-10-CM | POA: Diagnosis not present

## 2021-11-16 DIAGNOSIS — J45998 Other asthma: Secondary | ICD-10-CM | POA: Diagnosis not present

## 2022-02-18 DIAGNOSIS — E785 Hyperlipidemia, unspecified: Secondary | ICD-10-CM | POA: Diagnosis not present

## 2022-02-18 DIAGNOSIS — R7303 Prediabetes: Secondary | ICD-10-CM | POA: Diagnosis not present

## 2022-02-21 DIAGNOSIS — G5603 Carpal tunnel syndrome, bilateral upper limbs: Secondary | ICD-10-CM | POA: Diagnosis not present

## 2022-02-21 DIAGNOSIS — I10 Essential (primary) hypertension: Secondary | ICD-10-CM | POA: Diagnosis not present

## 2022-02-21 DIAGNOSIS — J45998 Other asthma: Secondary | ICD-10-CM | POA: Diagnosis not present

## 2022-02-21 DIAGNOSIS — N189 Chronic kidney disease, unspecified: Secondary | ICD-10-CM | POA: Diagnosis not present

## 2022-02-21 DIAGNOSIS — R7303 Prediabetes: Secondary | ICD-10-CM | POA: Diagnosis not present

## 2022-02-21 DIAGNOSIS — F411 Generalized anxiety disorder: Secondary | ICD-10-CM | POA: Diagnosis not present

## 2022-02-21 DIAGNOSIS — E785 Hyperlipidemia, unspecified: Secondary | ICD-10-CM | POA: Diagnosis not present

## 2022-02-21 DIAGNOSIS — K5909 Other constipation: Secondary | ICD-10-CM | POA: Diagnosis not present

## 2022-02-21 DIAGNOSIS — J301 Allergic rhinitis due to pollen: Secondary | ICD-10-CM | POA: Diagnosis not present

## 2022-02-28 IMAGING — MG MM DIGITAL SCREENING BILAT W/ TOMO AND CAD
6 of 10 series · 6 of 30 positions shown · non-contrast
Comparison: Previous exam(s).

CLINICAL DATA: Screening.

EXAM:
DIGITAL SCREENING BILATERAL MAMMOGRAM WITH TOMOSYNTHESIS AND CAD
TECHNIQUE: Bilateral screening digital craniocaudal and mediolateral oblique
mammograms were obtained. Bilateral screening digital breast
tomosynthesis was performed. The images were evaluated with
computer-aided detection.

[L MLO synth-2D]
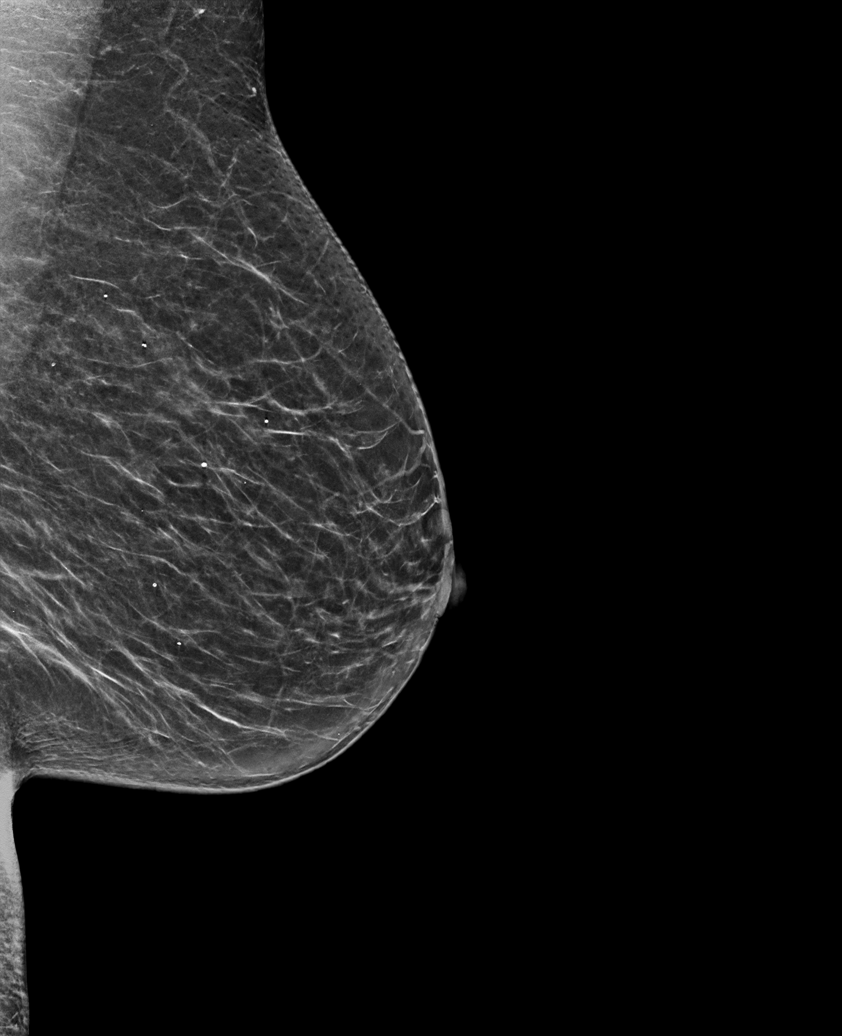

[R MLO synth-2D]
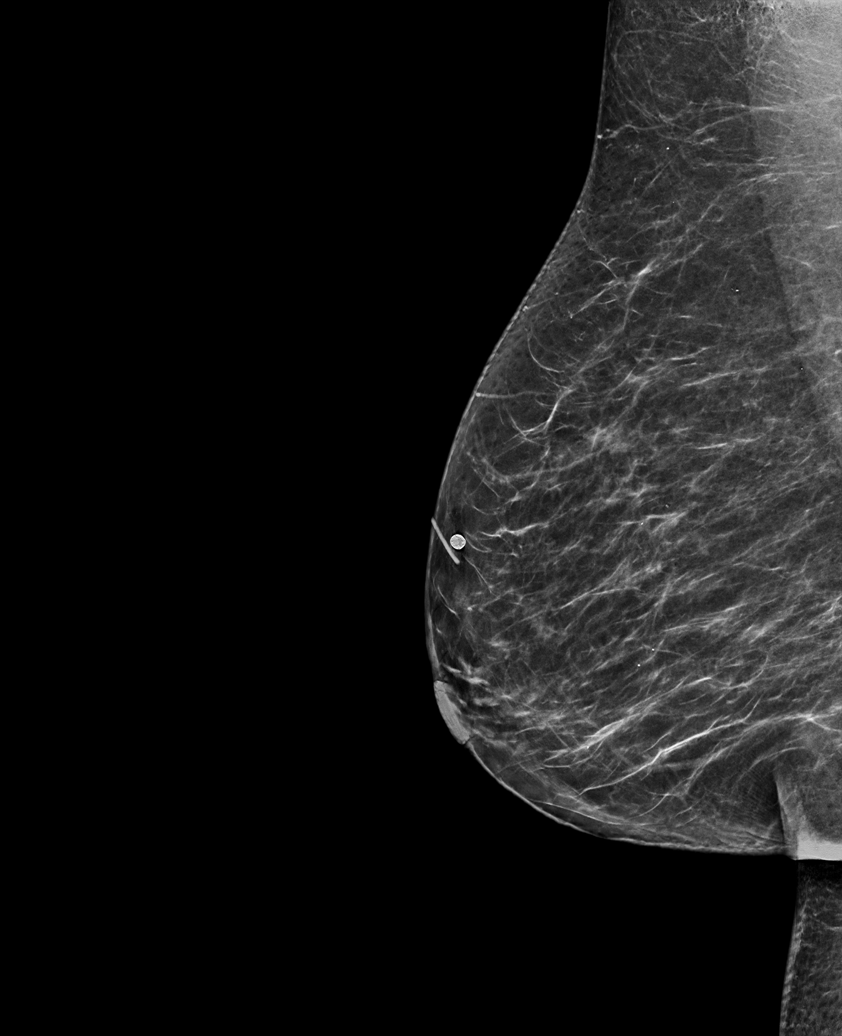

[R CC synth-2D]
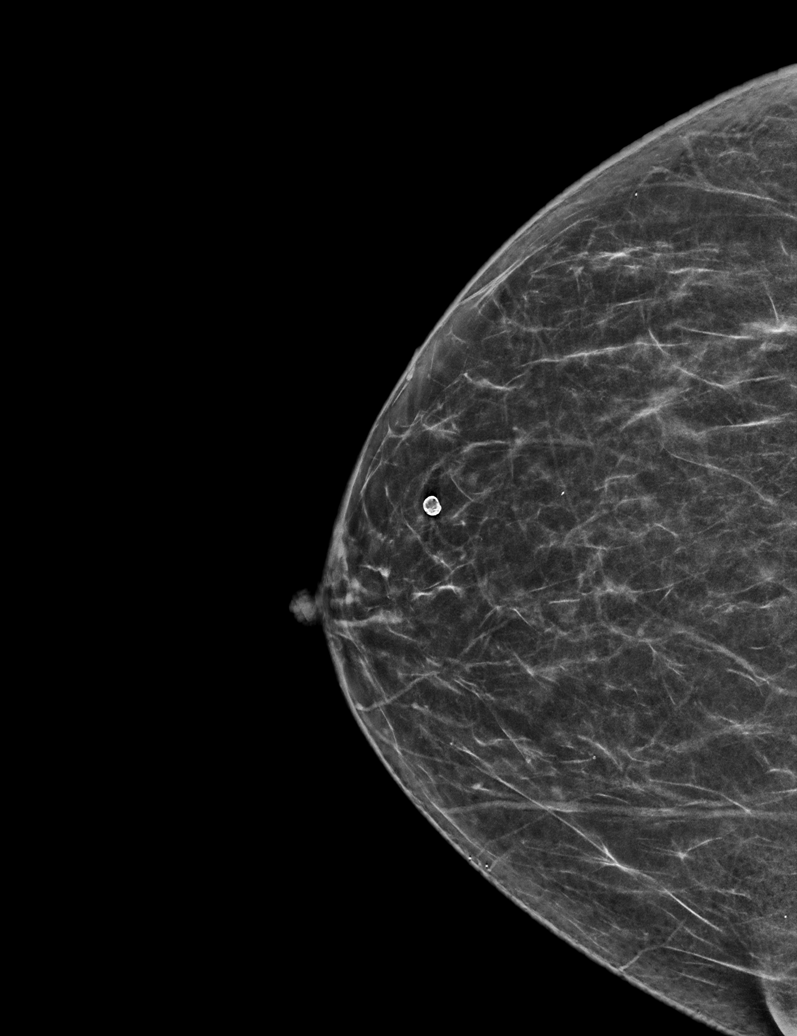

[L CC synth-2D]
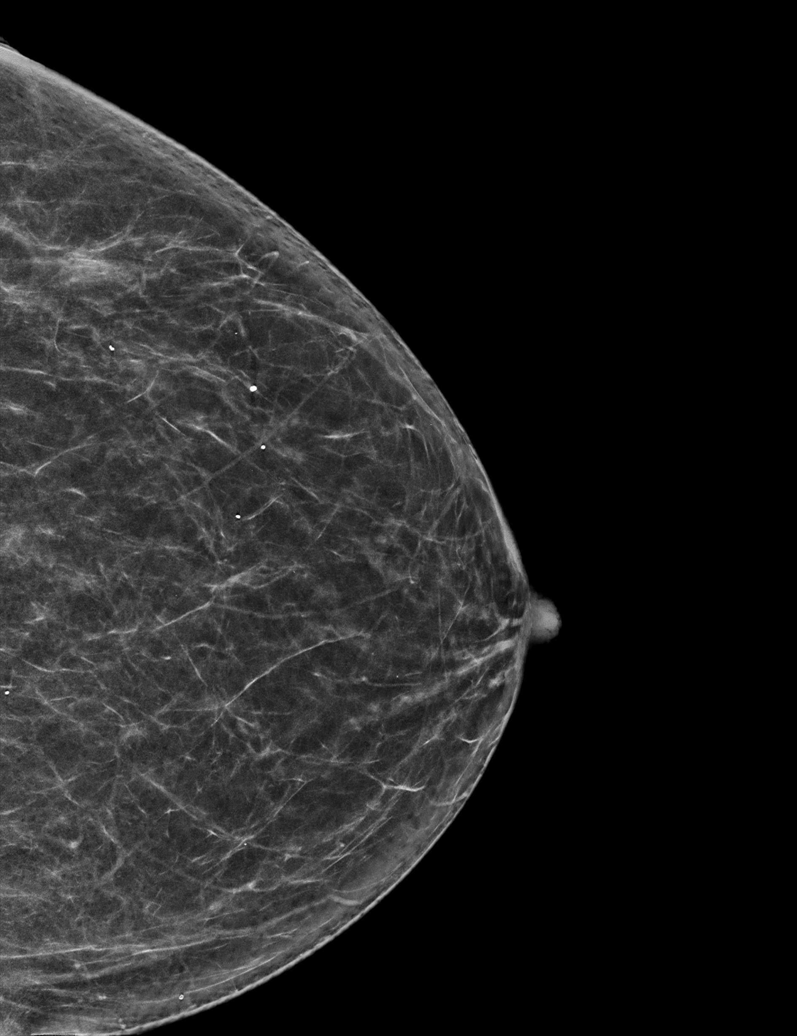

[L CV synth-2D]
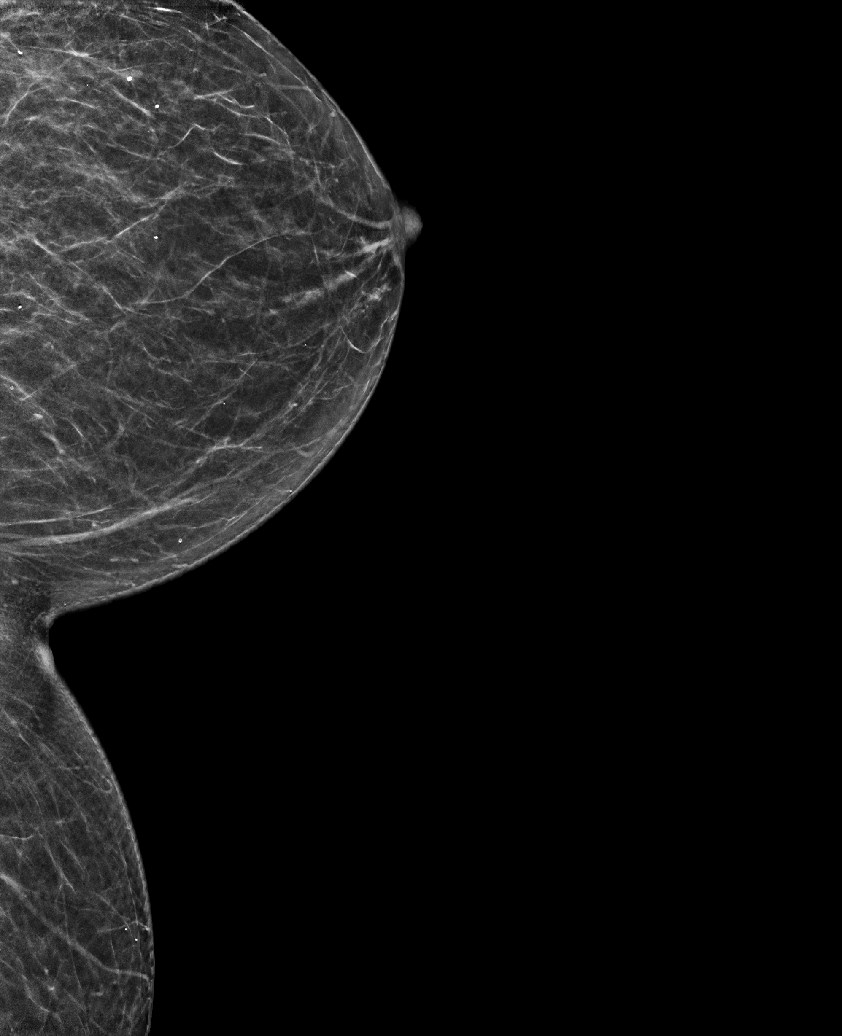

[L CV tomo · tomo slice 31/61.0]
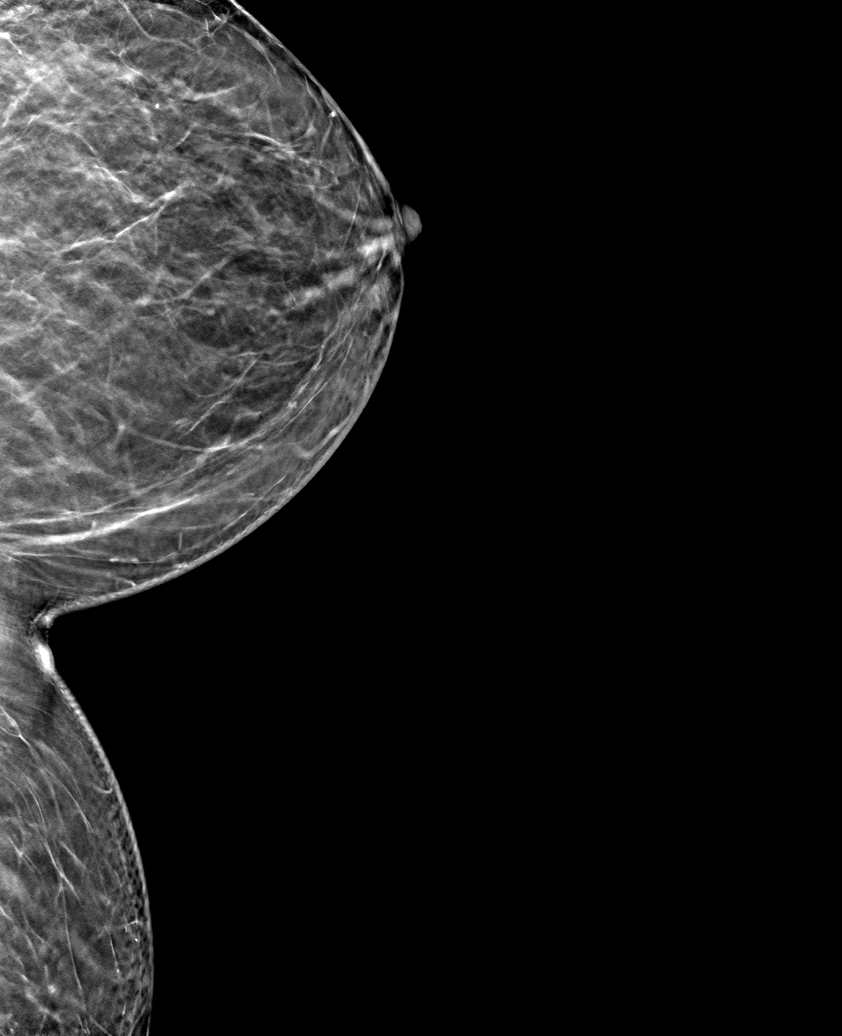

[6 of 30 positions shown; findings below may reference images not displayed]

ACR Breast Density Category b: There are scattered areas of
fibroglandular density.
FINDINGS: There are no findings suspicious for malignancy.
IMPRESSION: No mammographic evidence of malignancy. A result letter of this
screening mammogram will be mailed directly to the patient.

RECOMMENDATION:
Screening mammogram in one year. (Code:51-O-LD2)

BI-RADS CATEGORY  1: Negative.

## 2022-05-31 DIAGNOSIS — I10 Essential (primary) hypertension: Secondary | ICD-10-CM | POA: Diagnosis not present

## 2022-05-31 DIAGNOSIS — E785 Hyperlipidemia, unspecified: Secondary | ICD-10-CM | POA: Diagnosis not present

## 2022-06-06 DIAGNOSIS — I10 Essential (primary) hypertension: Secondary | ICD-10-CM | POA: Diagnosis not present

## 2022-06-06 DIAGNOSIS — R7303 Prediabetes: Secondary | ICD-10-CM | POA: Diagnosis not present

## 2022-06-06 DIAGNOSIS — Z739 Problem related to life management difficulty, unspecified: Secondary | ICD-10-CM | POA: Diagnosis not present

## 2022-06-06 DIAGNOSIS — E785 Hyperlipidemia, unspecified: Secondary | ICD-10-CM | POA: Diagnosis not present

## 2022-06-06 DIAGNOSIS — F411 Generalized anxiety disorder: Secondary | ICD-10-CM | POA: Diagnosis not present

## 2022-06-06 DIAGNOSIS — J45998 Other asthma: Secondary | ICD-10-CM | POA: Diagnosis not present

## 2022-06-06 DIAGNOSIS — Z0001 Encounter for general adult medical examination with abnormal findings: Secondary | ICD-10-CM | POA: Diagnosis not present

## 2022-06-06 DIAGNOSIS — Z1331 Encounter for screening for depression: Secondary | ICD-10-CM | POA: Diagnosis not present

## 2022-06-06 DIAGNOSIS — R4181 Age-related cognitive decline: Secondary | ICD-10-CM | POA: Diagnosis not present

## 2022-07-18 DIAGNOSIS — G5603 Carpal tunnel syndrome, bilateral upper limbs: Secondary | ICD-10-CM | POA: Diagnosis not present

## 2022-07-28 DIAGNOSIS — G5603 Carpal tunnel syndrome, bilateral upper limbs: Secondary | ICD-10-CM | POA: Diagnosis not present

## 2022-09-06 DIAGNOSIS — J301 Allergic rhinitis due to pollen: Secondary | ICD-10-CM | POA: Diagnosis not present

## 2022-09-06 DIAGNOSIS — I10 Essential (primary) hypertension: Secondary | ICD-10-CM | POA: Diagnosis not present

## 2022-09-06 DIAGNOSIS — G5603 Carpal tunnel syndrome, bilateral upper limbs: Secondary | ICD-10-CM | POA: Diagnosis not present

## 2022-09-06 DIAGNOSIS — E785 Hyperlipidemia, unspecified: Secondary | ICD-10-CM | POA: Diagnosis not present

## 2022-09-06 DIAGNOSIS — R7303 Prediabetes: Secondary | ICD-10-CM | POA: Diagnosis not present

## 2022-09-06 DIAGNOSIS — N189 Chronic kidney disease, unspecified: Secondary | ICD-10-CM | POA: Diagnosis not present

## 2022-09-06 DIAGNOSIS — K5909 Other constipation: Secondary | ICD-10-CM | POA: Diagnosis not present

## 2022-09-06 DIAGNOSIS — M13811 Other specified arthritis, right shoulder: Secondary | ICD-10-CM | POA: Diagnosis not present

## 2022-09-20 ENCOUNTER — Encounter: Payer: Self-pay | Admitting: Internal Medicine

## 2022-09-20 ENCOUNTER — Ambulatory Visit (INDEPENDENT_AMBULATORY_CARE_PROVIDER_SITE_OTHER): Payer: Medicare Other | Admitting: Internal Medicine

## 2022-09-20 VITALS — BP 112/80 | HR 68 | Ht 62.0 in | Wt 173.6 lb

## 2022-09-20 DIAGNOSIS — K5904 Chronic idiopathic constipation: Secondary | ICD-10-CM | POA: Insufficient documentation

## 2022-09-20 DIAGNOSIS — E782 Mixed hyperlipidemia: Secondary | ICD-10-CM | POA: Diagnosis not present

## 2022-09-20 DIAGNOSIS — I10 Essential (primary) hypertension: Secondary | ICD-10-CM | POA: Diagnosis not present

## 2022-09-20 MED ORDER — LINZESS 72 MCG PO CAPS: 72.0000 ug | ORAL_CAPSULE | Freq: Every morning | ORAL | 0 refills | Status: DC

## 2022-09-20 NOTE — Progress Notes (Signed)
Linzess samples given 72 mcg [id number] exp. 09/24

## 2022-09-20 NOTE — Progress Notes (Signed)
   Established Patient Office Visit  Subjective   Patient ID: Deborah Hurst, female    DOB: [birth year]  Age: 82 y.o. MRN: [id number]  Chief Complaint  Patient presents with   Follow-up    2 week follow up    HPI No new complaints, bp has improved and denies any headaches  Past Medical History:  Diagnosis Date   GERD (gastroesophageal reflux disease)    Hyperlipidemia    Hypertension    Social History   Tobacco Use   Smoking status: Never   Smokeless tobacco: Never  Vaping Use   Vaping Use: Never used  Substance Use Topics   Alcohol use: Never   Drug use: Never   Family Status  Relation Name Status   Mother  Deceased   Father  Deceased   Neg Hx  (Not Specified)   No Known Allergies    ROS    Objective:     BP 112/80 (BP Location: Left Arm, Patient Position: Sitting, Cuff Size: Normal)   Pulse 68   Ht 5\' 2"  (1.575 m)   Wt 173 lb 9.6 oz (78.7 kg)   SpO2 97%   BMI 31.75 kg/m  BP Readings from Last 3 Encounters:  09/20/22 112/80  02/12/21 117/70  06/08/20 (!) 176/93   Wt Readings from Last 3 Encounters:  09/20/22 173 lb 9.6 oz (78.7 kg)  02/12/21 170 lb (77.1 kg)  06/08/20 167 lb (75.8 kg)      Physical Exam   No results found for any visits on 09/20/22.  Last metabolic panel Lab Results  Component Value Date   GLUCOSE 103 (H) 06/08/2020   NA 136 06/08/2020   K 3.4 (L) 06/08/2020   CL 99 06/08/2020   CO2 28 06/08/2020   BUN 21 06/08/2020   CREATININE 1.14 (H) 06/08/2020   GFRNONAA 49 (L) 06/08/2020   CALCIUM 10.0 06/08/2020   PROT 8.1 06/08/2020   ALBUMIN 4.7 06/08/2020   BILITOT 0.8 06/08/2020   ALKPHOS 67 06/08/2020   AST 139 (H) 06/08/2020   ALT 67 (H) 06/08/2020   ANIONGAP 9 06/08/2020      The ASCVD Risk score (Arnett DK, et al., 2019) failed to calculate for the following reasons:   The 2019 ASCVD risk score is only valid for ages 23 to 48    Assessment & Plan:   Problem List Items Addressed This Visit    None   No follow-ups on file.    Volanda Napoleon, MD

## 2022-11-15 ENCOUNTER — Other Ambulatory Visit: Payer: Self-pay | Admitting: Internal Medicine

## 2022-11-15 ENCOUNTER — Other Ambulatory Visit: Payer: Self-pay | Admitting: Nurse Practitioner

## 2022-11-15 MED ORDER — LINACLOTIDE 72 MCG PO CAPS: ORAL_CAPSULE | ORAL | 0 refills | Status: DC

## 2022-12-19 ENCOUNTER — Ambulatory Visit: Payer: Medicare Other | Admitting: Internal Medicine

## 2022-12-27 ENCOUNTER — Encounter: Payer: Self-pay | Admitting: Internal Medicine

## 2022-12-27 ENCOUNTER — Ambulatory Visit (INDEPENDENT_AMBULATORY_CARE_PROVIDER_SITE_OTHER): Payer: Medicare Other | Admitting: Internal Medicine

## 2022-12-27 ENCOUNTER — Other Ambulatory Visit: Payer: Medicare Other

## 2022-12-27 VITALS — BP 142/70 | HR 72 | Ht 62.0 in | Wt 175.0 lb

## 2022-12-27 DIAGNOSIS — E782 Mixed hyperlipidemia: Secondary | ICD-10-CM | POA: Diagnosis not present

## 2022-12-27 DIAGNOSIS — K5904 Chronic idiopathic constipation: Secondary | ICD-10-CM

## 2022-12-27 DIAGNOSIS — F3289 Other specified depressive episodes: Secondary | ICD-10-CM

## 2022-12-27 DIAGNOSIS — I10 Essential (primary) hypertension: Secondary | ICD-10-CM | POA: Diagnosis not present

## 2022-12-27 DIAGNOSIS — F32A Depression, unspecified: Secondary | ICD-10-CM | POA: Insufficient documentation

## 2022-12-27 DIAGNOSIS — N951 Menopausal and female climacteric states: Secondary | ICD-10-CM | POA: Insufficient documentation

## 2022-12-27 MED ORDER — MIRTAZAPINE 15 MG PO TABS: 15.0000 mg | ORAL_TABLET | Freq: Every day | ORAL | 1 refills | Status: DC

## 2022-12-27 MED ORDER — CARVEDILOL 25 MG PO TABS: 25.0000 mg | ORAL_TABLET | Freq: Two times a day (BID) | ORAL | 1 refills | Status: DC

## 2022-12-27 MED ORDER — AMLODIPINE BESY-BENAZEPRIL HCL 10-20 MG PO CAPS: 1.0000 | ORAL_CAPSULE | Freq: Every day | ORAL | 1 refills | Status: DC

## 2022-12-27 MED ORDER — CLONIDINE HCL 0.1 MG PO TABS: 0.1000 mg | ORAL_TABLET | Freq: Every evening | ORAL | 1 refills | Status: DC

## 2022-12-27 MED ORDER — CHLORTHALIDONE 25 MG PO TABS: 25.0000 mg | ORAL_TABLET | Freq: Every day | ORAL | 1 refills | Status: DC

## 2022-12-27 MED ORDER — ROSUVASTATIN CALCIUM 5 MG PO TABS: 5.0000 mg | ORAL_TABLET | Freq: Every evening | ORAL | 0 refills | Status: DC

## 2022-12-27 NOTE — Progress Notes (Signed)
Established Patient Office Visit  Subjective:  Patient ID: Deborah Hurst, female    DOB: 04-15-1941  Age: 82 y.o. MRN: [id number]  Chief Complaint  Patient presents with   Follow-up    3 month follow up    No new complaints, here for lab review and medication refills. Failed to have previsit labs done.Still c/o pain with her carpal tunnel syndrome.    No other concerns at this time.   Past Medical History:  Diagnosis Date   GERD (gastroesophageal reflux disease)    Hyperlipidemia    Hypertension     Past Surgical History:  Procedure Laterality Date   ABDOMINAL HYSTERECTOMY     CHOLECYSTECTOMY     KNEE SURGERY Right     Social History   Socioeconomic History   Marital status: Married    Spouse name: Not on file   Number of children: Not on file   Years of education: Not on file   Highest education level: Not on file  Occupational History   Not on file  Tobacco Use   Smoking status: Never   Smokeless tobacco: Never  Vaping Use   Vaping Use: Never used  Substance and Sexual Activity   Alcohol use: Never   Drug use: Never   Sexual activity: Not on file  Other Topics Concern   Not on file  Social History Narrative   Not on file   Social Determinants of Health   Financial Resource Strain: Not on file  Food Insecurity: Not on file  Transportation Needs: Not on file  Physical Activity: Not on file  Stress: Not on file  Social Connections: Not on file  Intimate Partner Violence: Not on file    Family History  Problem Relation Age of Onset   Heart attack Mother 82   Prostate cancer Father    Breast cancer Neg Hx     No Known Allergies  Review of Systems  Constitutional: Negative.   HENT: Negative.    Eyes: Negative.   Respiratory: Negative.    Cardiovascular: Negative.   Gastrointestinal: Negative.   Genitourinary: Negative.   Skin: Negative.   Neurological: Negative.   Endo/Heme/Allergies: Negative.        Objective:   BP (!)  142/70   Pulse 72   Ht 5\' 2"  (1.575 m)   Wt 175 lb (79.4 kg)   SpO2 96%   BMI 32.01 kg/m   Vitals:   12/27/22 1549  BP: (!) 142/70  Pulse: 72  Height: 5\' 2"  (1.575 m)  Weight: 175 lb (79.4 kg)  SpO2: 96%  BMI (Calculated): 32    Physical Exam Vitals reviewed.  Constitutional:      General: She is not in acute distress. HENT:     Head: Normocephalic.     Nose: Nose normal.     Mouth/Throat:     Mouth: Mucous membranes are moist.  Eyes:     Extraocular Movements: Extraocular movements intact.     Pupils: Pupils are equal, round, and reactive to light.  Cardiovascular:     Rate and Rhythm: Normal rate and regular rhythm.     Heart sounds: No murmur heard. Pulmonary:     Effort: Pulmonary effort is normal.     Breath sounds: No rhonchi or rales.  Abdominal:     General: Abdomen is flat.     Palpations: There is no hepatomegaly, splenomegaly or mass.  Musculoskeletal:        General: Normal range of motion.  Cervical back: Normal range of motion. No tenderness.  Skin:    General: Skin is warm and dry.  Neurological:     General: No focal deficit present.     Mental Status: She is alert and oriented to person, place, and time.     Cranial Nerves: No cranial nerve deficit.     Motor: No weakness.  Psychiatric:        Mood and Affect: Mood normal.        Behavior: Behavior normal.      No results found for any visits on 12/27/22.  No results found for this or any previous visit (from the past 2160 hour([facility])).    Assessment & Plan:   Problem List Items Addressed This Visit   None   No follow-ups on file.   Total time spent: 20 minutes  Luna Fuse, MD  12/27/2022

## 2022-12-28 LAB — LIPID PANEL
Chol/HDL Ratio: 3 ratio (ref 0.0–4.4)
Cholesterol, Total: 149 mg/dL (ref 100–199)
HDL: 49 mg/dL (ref >39)
LDL Chol Calc (NIH): 78 mg/dL (ref 0–99)
Triglycerides: 121 mg/dL (ref 0–149)
VLDL Cholesterol Cal: 22 mg/dL (ref 5–40)

## 2022-12-28 LAB — CREATININE KINASE MB: CK-MB Index: 1.1 ng/mL (ref 0.0–5.3)

## 2022-12-28 LAB — HEPATIC FUNCTION PANEL
ALT: 13 IU/L (ref 0–32)
AST: 19 IU/L (ref 0–40)
Albumin: 4.4 g/dL (ref 3.8–4.8)
Alkaline Phosphatase: 59 IU/L (ref 44–121)
Bilirubin Total: 0.3 mg/dL (ref 0.0–1.2)
Bilirubin, Direct: 0.1 mg/dL (ref 0.00–0.40)
Total Protein: 6.6 g/dL (ref 6.0–8.5)

## 2023-01-12 ENCOUNTER — Telehealth: Payer: Self-pay

## 2023-01-12 NOTE — Telephone Encounter (Signed)
Patient called stating that she picked up 3 BP meds and now her BP is below 100 and she wnts to know what she should be taking

## 2023-02-01 ENCOUNTER — Ambulatory Visit (INDEPENDENT_AMBULATORY_CARE_PROVIDER_SITE_OTHER): Payer: Medicare Other

## 2023-02-01 ENCOUNTER — Encounter: Payer: Self-pay | Admitting: Emergency Medicine

## 2023-02-01 ENCOUNTER — Ambulatory Visit
Admission: EM | Admit: 2023-02-01 | Discharge: 2023-02-01 | Disposition: A | Discharge status: Home / Self Care | Payer: Medicare Other | Attending: Family Medicine | Admitting: Family Medicine

## 2023-02-01 DIAGNOSIS — M47816 Spondylosis without myelopathy or radiculopathy, lumbar region: Secondary | ICD-10-CM

## 2023-02-01 DIAGNOSIS — Z043 Encounter for examination and observation following other accident: Secondary | ICD-10-CM | POA: Diagnosis not present

## 2023-02-01 DIAGNOSIS — M1712 Unilateral primary osteoarthritis, left knee: Secondary | ICD-10-CM

## 2023-02-01 DIAGNOSIS — M4316 Spondylolisthesis, lumbar region: Secondary | ICD-10-CM | POA: Diagnosis not present

## 2023-02-01 MED ORDER — TIZANIDINE HCL 4 MG PO TABS: 4.0000 mg | ORAL_TABLET | Freq: Three times a day (TID) | ORAL | 0 refills | Status: DC | PRN

## 2023-02-01 MED ORDER — PREDNISONE 10 MG (21) PO TBPK: ORAL_TABLET | Freq: Every day | ORAL | 0 refills | Status: DC

## 2023-02-01 NOTE — Discharge Instructions (Addendum)
Your knee xray showed mild osteoarthritis. Your low back xray showed fractures. There was some age related changes.  Follow up with your primary care provider for referral to a spine specialist or call [facility] for an appointment.   If medication was prescribed, stop by the pharmacy to pick up your prescriptions.  For your back pain, Take 1000 mg Tylenol twice a da with 2-200 mg tablets of Motrin every 8 hours as needed for pain. Take muscle relaxer (Zanaflex) at bedtime and increase to 2-3 times a day if needed. Consider stopping by the pharmacy or dollar store to pick up some Lidocaine patches. Apply for 12 hours and then remove.   Watch for worsening symptoms such as an increasing weakness or loss of sensation in your arms or legs, increasing pain and/or the loss of bladder or bowel function. Should any of these occur, go to the emergency department immediately.

## 2023-02-01 NOTE — ED Provider Notes (Signed)
[facility]    CSN: [id number] Arrival date & time: 02/01/23  7829      History   Chief Complaint Chief Complaint  Patient presents with   Fall    HPI  HPI Deborah Hurst is a 82 y.o. female.   Joaly presents after a fall about 2 weeks ago. She was vomiting down the stairs and missed the bottom 1-2 steps. She can down on her left knee.  Pt has left knee pain and back pain. Making a bowel movement hurts. Tylenol and ibuprofen without relief. Has not fallen in a long time. No new urinary or bowel incontinence. Denies saddle paresthesia. No dysuria. She tries to drink a lot of water.  She has pain in her left knee that is worse with walking. Her knee catches when she walking. Denies previous injury to this knee. Doesn't feel like her legs are weak.      Denies other pains including headache and neck pain.    Past Medical History:  Diagnosis Date   GERD (gastroesophageal reflux disease)    Hyperlipidemia    Hypertension     Patient Active Problem List   Diagnosis Date Noted   Depression 12/27/2022   Hot flashes due to menopause 12/27/2022   Primary hypertension 09/20/2022   Chronic idiopathic constipation 09/20/2022   Mixed hyperlipidemia 09/20/2022    Past Surgical History:  Procedure Laterality Date   ABDOMINAL HYSTERECTOMY     CHOLECYSTECTOMY     KNEE SURGERY Right     OB History   No obstetric history on file.      Home Medications    Prior to Admission medications   Medication Sig Start Date End Date Taking? Authorizing Provider  predniSONE (STERAPRED UNI-PAK 21 TAB) 10 MG (21) TBPK tablet Take by mouth daily. Take 6 tabs by mouth daily for 1, then 5 tabs for 1 day, then 4 tabs for 1 day, then 3 tabs for 1 day, then 2 tabs for 1 day, then 1 tab for 1 day. 02/01/23  Yes Shaneeka Scarboro, DO  tiZANidine (ZANAFLEX) 4 MG tablet Take 1 tablet (4 mg total) by mouth every 8 (eight) hours as needed for muscle spasms. 02/01/23  Yes Terease Marcotte,  DO  amLODipine-benazepril (LOTREL) 10-20 MG capsule Take 1 capsule by mouth daily. 12/27/22 06/25/23  Sherron Monday, MD  carvedilol (COREG) 25 MG tablet Take 1 tablet (25 mg total) by mouth 2 (two) times daily. 12/27/22 06/25/23  Sherron Monday, MD  chlorthalidone (HYGROTON) 25 MG tablet Take 1 tablet (25 mg total) by mouth daily. 12/27/22 06/25/23  Sherron Monday, MD  cloNIDine (CATAPRES) 0.1 MG tablet Take 1 tablet (0.1 mg total) by mouth at bedtime. 12/27/22 06/25/23  Sherron Monday, MD  fluticasone (FLONASE) 50 MCG/ACT nasal spray     [provider]  gabapentin (NEURONTIN) 300 MG capsule Take 1 capsule by mouth 2 (two) times daily.    [provider]  linaclotide (LINZESS) 72 MCG capsule TAKE 1 CAPSULE BY MOUTH EVERY DAY IN THE MORNING 11/15/22   Orson Eva, NP  meloxicam (MOBIC) 15 MG tablet Take 1 tablet by mouth daily. 06/12/20   [provider]  mirtazapine (REMERON) 15 MG tablet Take 1 tablet (15 mg total) by mouth at bedtime. 12/27/22 06/25/23  Sherron Monday, MD  pantoprazole (PROTONIX) 40 MG tablet Take by mouth.    [provider]  rosuvastatin (CRESTOR) 5 MG tablet Take 1 tablet (5 mg total) by  mouth at bedtime. 12/27/22 03/27/23  Sherron Monday, MD    Family History Family History  Problem Relation Age of Onset   Heart attack Mother 44   Prostate cancer Father    Breast cancer Neg Hx     Social History Social History   Tobacco Use   Smoking status: Never   Smokeless tobacco: Never  Vaping Use   Vaping Use: Never used  Substance Use Topics   Alcohol use: Never   Drug use: Never     Allergies   Patient has no known allergies.   Review of Systems Review of Systems: :negative unless otherwise stated in HPI.      Physical Exam Triage Vital Signs ED Triage Vitals  Enc Vitals Group     BP      Pulse      Resp      Temp      Temp src      SpO2      Weight      Height      Head Circumference       Peak Flow      Pain Score      Pain Loc      Pain Edu?      Excl. in GC?    No data found.  Updated Vital Signs BP 134/81 (BP Location: Left Arm)   Pulse 63   Temp 98.4 F (36.9 C) (Oral)   Resp 16   SpO2 96%   Visual Acuity Right Eye Distance:   Left Eye Distance:   Bilateral Distance:    Right Eye Near:   Left Eye Near:    Bilateral Near:     Physical Exam GEN: well appearing female in no acute distress  CVS: well perfused  RESP: speaking in full sentences without pause, no respiratory distress  MSK:   Left  Knee Exam -Inspection: no deformity, no discoloration, no edema -Palpation: lateral joint line and posterior tenderness, no effusion -ROM: good ROM -Special Tests: Varus Stress: Negative; Valgus Stress: Negative; Anterior drawer: Negative; Posterior drawer: Negative; Patellar grind: Negative -Limb neurovascularly intact, no instability noted  Lumbar spine: - Inspection: no gross deformity or asymmetry, swelling or ecchymosis. No skin changes - Palpation:  TTP over the lumbar spinous processes, and right paraspinal muscles, no SI joints b/l - ROM: limited ROM due to pain - Strength: 5/5 strength of lower extremity in L4-S1 nerve root distributions b/l - Neuro: sensation intact in the L4-S1 nerve root distribution b/l - Special testing: Negative straight leg raise    UC Treatments / Results  Labs (all labs ordered are listed, but only abnormal results are displayed) Labs Reviewed - No data to display  EKG   Radiology DG Lumbar Spine Complete  Result Date: 02/01/2023 CLINICAL DATA:  Status post fall 2 weeks ago EXAM: LUMBAR SPINE - COMPLETE 4+ VIEW COMPARISON:  None Available. FINDINGS: 5 nonrib bearing lumbar-type vertebral bodies. Vertebral body heights are maintained. No acute fracture. Generalized osteopenia. Grade 1 anterolisthesis of L4 on L5 secondary to facet disease. No spondylolysis. Degenerative disease with disc height loss throughout the  lower thoracic spine. Degenerative disease with disc height loss at L4-5 and L5-S1. Bilateral facet arthropathy at L4-5 and L5-S1. SI joints are unremarkable. IMPRESSION: 1. No acute osseous injury of the lumbar spine. 2. Lumbar spine spondylosis as described above. Electronically Signed   By: Elige Ko M.D.   On: 02/01/2023 11:29   DG Knee Complete 4 Views  Left  Result Date: 02/01/2023 CLINICAL DATA:  Status post fall 2 weeks ago EXAM: LEFT KNEE - COMPLETE 4+ VIEW COMPARISON:  None Available. FINDINGS: No acute fracture or dislocation. No aggressive osseous lesion. Normal alignment. 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 Soft tissue are unremarkable. No radiopaque foreign body or soft tissue emphysema. IMPRESSION: 1. No acute osseous injury of the left knee. Electronically Signed   By: Elige Ko M.D.   On: 02/01/2023 11:28     Procedures Procedures (including critical care time)  Medications Ordered in UC Medications - No data to display  Initial Impression / Assessment and Plan / UC Course  I have reviewed the triage vital signs and the nursing notes.  Pertinent labs & imaging results that were available during my care of the patient were reviewed by me and considered in my medical decision making (see chart for details).      Pt is a 82 y.o.  female presents after a fall 2 weeks ago for ongoing lumbar and left knee pain. On exam, pt has tenderness at lumbar spinous processes concerning for compression fracture.   She has posterolateral knee tenderness. Obtained lumbar and left knee plain films.  Personally interpreted by me were unremarkable for fracture or dislocation. Radiologist report reviewed and additionally notes  no soft tissue swelling, lumbar spondylosis, medial femorotibial compartment left knee osteoarthritis.  Results reviewed with patient and her female family member.   Patient to gradually return to normal activities, as tolerated and continue  ordinary activities within the limits permitted by pain. Prescribed steroid taper, muscle relaxer  for pain relief.  Tylenol PRN. Motrin for additional pain relief if needed. Counseled patient on red flag symptoms and when to seek immediate care.    Patient to follow up with orthopedic provider, if symptoms do not improve with conservative treatment.  Return and ED precautions given. Understanding voiced. Discussed MDM, treatment plan and plan for follow-up with patient who agrees with plan.   Final Clinical Impressions(s) / UC Diagnoses   Final diagnoses:  Primary osteoarthritis of left knee  Lumbar spondylosis     Discharge Instructions      Your knee xray showed mild osteoarthritis. Your low back xray showed fractures. There was some age related changes.  Follow up with your primary care provider for referral to a spine specialist or call [facility] for an appointment.   If medication was prescribed, stop by the pharmacy to pick up your prescriptions.  For your back pain, Take 1000 mg Tylenol twice a da with 2-200 mg tablets of Motrin every 8 hours as needed for pain. Take muscle relaxer (Zanaflex) at bedtime and increase to 2-3 times a day if needed. Consider stopping by the pharmacy or dollar store to pick up some Lidocaine patches. Apply for 12 hours and then remove.   Watch for worsening symptoms such as an increasing weakness or loss of sensation in your arms or legs, increasing pain and/or the loss of bladder or bowel function. Should any of these occur, go to the emergency department immediately.       ED Prescriptions     Medication Sig Dispense Auth. Provider   predniSONE (STERAPRED UNI-PAK 21 TAB) 10 MG (21) TBPK tablet Take by mouth daily. Take 6 tabs by mouth daily for 1, then 5 tabs for 1 day, then 4 tabs for 1 day, then 3 tabs for 1 day, then 2 tabs for 1 day, then 1 tab for 1 day. 21 tablet Lexander Tremblay,  Carvel Huskins, DO   tiZANidine (ZANAFLEX) 4 MG tablet Take 1 tablet (4 mg  total) by mouth every 8 (eight) hours as needed for muscle spasms. 30 tablet Emaline Karnes, Seward Meth, DO      I have reviewed the PDMP during this encounter.   Katha Cabal, DO 02/01/23 1325

## 2023-02-01 NOTE — ED Triage Notes (Signed)
Pt was at home and missed the last steps and fell 2 weeks ago. She has right side pain and lower back pain. She has tried OTC pain medication with no relief.

## 2023-02-02 ENCOUNTER — Emergency Department: Payer: Medicare Other

## 2023-02-02 ENCOUNTER — Inpatient Hospital Stay
Admission: EM | Admit: 2023-02-02 | Discharge: 2023-02-04 | DRG: 916 | Disposition: A | Discharge status: Home / Self Care | Payer: Medicare Other | Attending: Internal Medicine | Admitting: Internal Medicine

## 2023-02-02 ENCOUNTER — Other Ambulatory Visit: Payer: Self-pay

## 2023-02-02 DIAGNOSIS — T461X5A Adverse effect of calcium-channel blockers, initial encounter: Secondary | ICD-10-CM | POA: Diagnosis present

## 2023-02-02 DIAGNOSIS — Z79899 Other long term (current) drug therapy: Secondary | ICD-10-CM

## 2023-02-02 DIAGNOSIS — I959 Hypotension, unspecified: Secondary | ICD-10-CM | POA: Diagnosis not present

## 2023-02-02 DIAGNOSIS — K219 Gastro-esophageal reflux disease without esophagitis: Secondary | ICD-10-CM | POA: Diagnosis not present

## 2023-02-02 DIAGNOSIS — S0990XA Unspecified injury of head, initial encounter: Secondary | ICD-10-CM | POA: Diagnosis not present

## 2023-02-02 DIAGNOSIS — W19XXXA Unspecified fall, initial encounter: Secondary | ICD-10-CM | POA: Diagnosis not present

## 2023-02-02 DIAGNOSIS — M25562 Pain in left knee: Secondary | ICD-10-CM | POA: Diagnosis not present

## 2023-02-02 DIAGNOSIS — E669 Obesity, unspecified: Secondary | ICD-10-CM | POA: Diagnosis present

## 2023-02-02 DIAGNOSIS — Z1152 Encounter for screening for COVID-19: Secondary | ICD-10-CM | POA: Diagnosis not present

## 2023-02-02 DIAGNOSIS — I10 Essential (primary) hypertension: Secondary | ICD-10-CM | POA: Diagnosis not present

## 2023-02-02 DIAGNOSIS — T447X5A Adverse effect of beta-adrenoreceptor antagonists, initial encounter: Secondary | ICD-10-CM | POA: Diagnosis not present

## 2023-02-02 DIAGNOSIS — G5600 Carpal tunnel syndrome, unspecified upper limb: Secondary | ICD-10-CM | POA: Diagnosis not present

## 2023-02-02 DIAGNOSIS — Z6831 Body mass index (BMI) 31.0-31.9, adult: Secondary | ICD-10-CM | POA: Diagnosis not present

## 2023-02-02 DIAGNOSIS — Z743 Need for continuous supervision: Secondary | ICD-10-CM | POA: Diagnosis not present

## 2023-02-02 DIAGNOSIS — R6889 Other general symptoms and signs: Secondary | ICD-10-CM | POA: Diagnosis not present

## 2023-02-02 DIAGNOSIS — M549 Dorsalgia, unspecified: Secondary | ICD-10-CM | POA: Diagnosis present

## 2023-02-02 DIAGNOSIS — S299XXA Unspecified injury of thorax, initial encounter: Secondary | ICD-10-CM | POA: Diagnosis not present

## 2023-02-02 DIAGNOSIS — R55 Syncope and collapse: Secondary | ICD-10-CM | POA: Diagnosis not present

## 2023-02-02 DIAGNOSIS — T502X5A Adverse effect of carbonic-anhydrase inhibitors, benzothiadiazides and other diuretics, initial encounter: Secondary | ICD-10-CM | POA: Diagnosis not present

## 2023-02-02 DIAGNOSIS — I499 Cardiac arrhythmia, unspecified: Secondary | ICD-10-CM | POA: Diagnosis not present

## 2023-02-02 DIAGNOSIS — E785 Hyperlipidemia, unspecified: Secondary | ICD-10-CM | POA: Diagnosis present

## 2023-02-02 DIAGNOSIS — T886XXA Anaphylactic reaction due to adverse effect of correct drug or medicament properly administered, initial encounter: Secondary | ICD-10-CM | POA: Diagnosis not present

## 2023-02-02 DIAGNOSIS — S199XXA Unspecified injury of neck, initial encounter: Secondary | ICD-10-CM | POA: Diagnosis not present

## 2023-02-02 DIAGNOSIS — S3993XA Unspecified injury of pelvis, initial encounter: Secondary | ICD-10-CM | POA: Diagnosis not present

## 2023-02-02 DIAGNOSIS — S3991XA Unspecified injury of abdomen, initial encounter: Secondary | ICD-10-CM | POA: Diagnosis not present

## 2023-02-02 DIAGNOSIS — T428X5A Adverse effect of antiparkinsonism drugs and other central muscle-tone depressants, initial encounter: Secondary | ICD-10-CM | POA: Diagnosis present

## 2023-02-02 DIAGNOSIS — Z9181 History of falling: Secondary | ICD-10-CM

## 2023-02-02 DIAGNOSIS — R404 Transient alteration of awareness: Secondary | ICD-10-CM | POA: Diagnosis not present

## 2023-02-02 LAB — CBC
HCT: 31.6 % — ABNORMAL LOW (ref 36.0–46.0)
Hemoglobin: 10.5 g/dL — ABNORMAL LOW (ref 12.0–15.0)
MCH: 30.9 pg (ref 26.0–34.0)
MCHC: 33.2 g/dL (ref 30.0–36.0)
MCV: 92.9 fL (ref 80.0–100.0)
Platelets: 241 10*3/uL (ref 150–400)
RBC: 3.4 MIL/uL — ABNORMAL LOW (ref 3.87–5.11)
RDW: 12.5 % (ref 11.5–15.5)
WBC: 5.8 10*3/uL (ref 4.0–10.5)
nRBC: 0 % (ref 0.0–0.2)

## 2023-02-02 LAB — PROTIME-INR
INR: 1.1 (ref 0.8–1.2)
Prothrombin Time: 14.2 seconds (ref 11.4–15.2)

## 2023-02-02 LAB — URINALYSIS, W/ REFLEX TO CULTURE (INFECTION SUSPECTED)
Bacteria, UA: NONE SEEN
Bilirubin Urine: NEGATIVE
Glucose, UA: NEGATIVE mg/dL
Hgb urine dipstick: NEGATIVE
Ketones, ur: NEGATIVE mg/dL
Nitrite: NEGATIVE
Protein, ur: NEGATIVE mg/dL
Specific Gravity, Urine: 1.013 (ref 1.005–1.030)
pH: 6 (ref 5.0–8.0)

## 2023-02-02 LAB — COMPREHENSIVE METABOLIC PANEL
ALT: 13 U/L (ref 0–44)
AST: 18 U/L (ref 15–41)
Albumin: 3.5 g/dL (ref 3.5–5.0)
Alkaline Phosphatase: 39 U/L (ref 38–126)
Anion gap: 6 (ref 5–15)
BUN: 25 mg/dL — ABNORMAL HIGH (ref 8–23)
CO2: 24 mmol/L (ref 22–32)
Calcium: 8.2 mg/dL — ABNORMAL LOW (ref 8.9–10.3)
Chloride: 107 mmol/L (ref 98–111)
Creatinine, Ser: 1.18 mg/dL — ABNORMAL HIGH (ref 0.44–1.00)
GFR, Estimated: 47 mL/min — ABNORMAL LOW (ref >60)
Glucose, Bld: 141 mg/dL — ABNORMAL HIGH (ref 70–99)
Potassium: 3.3 mmol/L — ABNORMAL LOW (ref 3.5–5.1)
Sodium: 137 mmol/L (ref 135–145)
Total Bilirubin: 0.5 mg/dL (ref 0.3–1.2)
Total Protein: 5.8 g/dL — ABNORMAL LOW (ref 6.5–8.1)

## 2023-02-02 LAB — TROPONIN I (HIGH SENSITIVITY)
Troponin I (High Sensitivity): 4 ng/L (ref <18)
Troponin I (High Sensitivity): 6 ng/L (ref <18)

## 2023-02-02 LAB — BRAIN NATRIURETIC PEPTIDE: B Natriuretic Peptide: 82.2 pg/mL (ref 0.0–100.0)

## 2023-02-02 LAB — PROCALCITONIN: Procalcitonin: <0.1 ng/mL

## 2023-02-02 LAB — LACTIC ACID, PLASMA
Lactic Acid, Venous: 1.7 mmol/L (ref 0.5–1.9)
Lactic Acid, Venous: 0.9 mmol/L (ref 0.5–1.9)
Lactic Acid, Venous: 0.7 mmol/L (ref 0.5–1.9)
Lactic Acid, Venous: 1 mmol/L (ref 0.5–1.9)

## 2023-02-02 LAB — MRSA NEXT GEN BY PCR, NASAL: MRSA by PCR Next Gen: NOT DETECTED

## 2023-02-02 LAB — LIPASE, BLOOD: Lipase: 31 U/L (ref 11–51)

## 2023-02-02 LAB — APTT: aPTT: 21 seconds — ABNORMAL LOW (ref 24–36)

## 2023-02-02 LAB — SARS CORONAVIRUS 2 BY RT PCR: SARS Coronavirus 2 by RT PCR: NEGATIVE

## 2023-02-02 LAB — CORTISOL: Cortisol, Plasma: 3.9 ug/dL

## 2023-02-02 MED ORDER — SODIUM CHLORIDE 0.9 % IV SOLN
250.0000 mL | INTRAVENOUS | Status: DC
  Administered 2023-02-02: 250 mL via INTRAVENOUS

## 2023-02-02 MED ORDER — SODIUM CHLORIDE 0.9 % IV BOLUS
1000.0000 mL | Freq: Once | INTRAVENOUS | Status: AC
  Administered 2023-02-02: 1000 mL via INTRAVENOUS

## 2023-02-02 MED ORDER — SODIUM CHLORIDE 0.9 % IV SOLN
2.0000 g | INTRAVENOUS | Status: DC
  Administered 2023-02-03 – 2023-02-04 (×2): 2 g via INTRAVENOUS
  Filled 2023-02-02 (×2): qty 20

## 2023-02-02 MED ORDER — LACTATED RINGERS IV SOLN: INTRAVENOUS | Status: AC

## 2023-02-02 MED ORDER — ONDANSETRON HCL 4 MG/2ML IJ SOLN
4.0000 mg | Freq: Once | INTRAMUSCULAR | Status: AC
  Administered 2023-02-02: 4 mg via INTRAVENOUS
  Filled 2023-02-02: qty 2

## 2023-02-02 MED ORDER — ENOXAPARIN SODIUM 40 MG/0.4ML IJ SOSY
40.0000 mg | PREFILLED_SYRINGE | INTRAMUSCULAR | Status: DC
  Administered 2023-02-02 – 2023-02-03 (×2): 40 mg via SUBCUTANEOUS
  Filled 2023-02-02 (×2): qty 0.4

## 2023-02-02 MED ORDER — NOREPINEPHRINE 4 MG/250ML-% IV SOLN
INTRAVENOUS | Status: AC
  Administered 2023-02-02: 5 ug/min via INTRAVENOUS
  Filled 2023-02-02: qty 250

## 2023-02-02 MED ORDER — ONDANSETRON HCL 4 MG/2ML IJ SOLN: 4.0000 mg | Freq: Four times a day (QID) | INTRAMUSCULAR | Status: DC | PRN

## 2023-02-02 MED ORDER — VANCOMYCIN HCL IN DEXTROSE 1-5 GM/200ML-% IV SOLN
1000.0000 mg | Freq: Once | INTRAVENOUS | Status: AC
  Administered 2023-02-02: 1000 mg via INTRAVENOUS
  Filled 2023-02-02: qty 200

## 2023-02-02 MED ORDER — NOREPINEPHRINE 4 MG/250ML-% IV SOLN: 0.0000 ug/min | INTRAVENOUS | Status: DC

## 2023-02-02 MED ORDER — SODIUM CHLORIDE 0.9 % IV SOLN
500.0000 mg | INTRAVENOUS | Status: DC
  Administered 2023-02-02 – 2023-02-03 (×2): 500 mg via INTRAVENOUS
  Filled 2023-02-02 (×3): qty 5

## 2023-02-02 MED ORDER — SODIUM CHLORIDE 0.9 % IV SOLN
2.0000 g | Freq: Once | INTRAVENOUS | Status: AC
  Administered 2023-02-02: 2 g via INTRAVENOUS
  Filled 2023-02-02: qty 12.5

## 2023-02-02 MED ORDER — POTASSIUM CHLORIDE CRYS ER 20 MEQ PO TBCR
40.0000 meq | EXTENDED_RELEASE_TABLET | Freq: Once | ORAL | Status: AC
  Administered 2023-02-02: 40 meq via ORAL
  Filled 2023-02-02: qty 2

## 2023-02-02 MED ORDER — NOREPINEPHRINE 4 MG/250ML-% IV SOLN: 2.0000 ug/min | INTRAVENOUS | Status: DC

## 2023-02-02 MED ORDER — IOHEXOL 300 MG/ML  SOLN
100.0000 mL | Freq: Once | INTRAMUSCULAR | Status: AC | PRN
  Administered 2023-02-02: 100 mL via INTRAVENOUS

## 2023-02-02 MED ORDER — POLYETHYLENE GLYCOL 3350 17 G PO PACK: 17.0000 g | PACK | Freq: Every day | ORAL | Status: DC | PRN

## 2023-02-02 MED ORDER — DOCUSATE SODIUM 100 MG PO CAPS: 100.0000 mg | ORAL_CAPSULE | Freq: Two times a day (BID) | ORAL | Status: DC | PRN

## 2023-02-02 MED ORDER — ORAL CARE MOUTH RINSE: 15.0000 mL | OROMUCOSAL | Status: DC | PRN

## 2023-02-02 NOTE — Progress Notes (Signed)
PHARMACY -  BRIEF ANTIBIOTIC NOTE   Pharmacy has received consult(s) for vancomycin and cefepime from an ED provider.  The patient's profile has been reviewed for ht/wt/allergies/indication/available labs.    One time order(s) placed for vancomycin 1,000 mg x 1 and cefepime 2 grams x 1  Further antibiotics/pharmacy consults should be ordered by admitting physician if indicated.                       Thank you,  Elliot Gurney, PharmD, BCPS Clinical Pharmacist  02/02/2023 1:03 PM

## 2023-02-02 NOTE — ED Triage Notes (Signed)
Pt arrived via EMS from home d/t near syncopal episode at home that was witnessed by family. Pt recently started a new muscle relaxer called tizanidine. Pt denies any pain. Hx of hypertension. Pt initial BP was 80/40 per EMS pt is now stable with BP of  60/48 in triage

## 2023-02-02 NOTE — H&P (Signed)
NAME:  Deborah Hurst, MRN:  [id number], DOB:  [birth year], LOS: 0 ADMISSION DATE:  02/02/2023, CONSULTATION DATE: 02/02/2023 REFERRING MD: EDP, CHIEF COMPLAINT: Fall, shock  History of Present Illness:   82 year old with history of hypertension, GERD, hyperlipidemia, obesity presenting with near syncope, fall and hypotension.  She started on pressors peripherally after getting IV fluids and PCCM consulted for help with admission  She had a mechanical fall 2 weeks ago with spine, knee pain and was seen in urgent care on 6/19.  Lumbar and knee films did not show any fracture and she was prescribed steroid taper, Zanaflex.   Pertinent  Medical History    has a past medical history of GERD (gastroesophageal reflux disease), Hyperlipidemia, and Hypertension.   Significant Hospital Events: Including procedures, antibiotic start and stop dates in addition to other pertinent events     Interim History / Subjective:    Objective   Blood pressure (!) 107/57, pulse (!) 52, temperature (!) 97.4 F (36.3 C), temperature source Oral, resp. rate 18, height 5\' 2"  (1.575 m), weight 78.9 kg, SpO2 100 %.       No intake or output data in the 24 hours ending 02/02/23 1642 Filed Weights   02/02/23 1139  Weight: 78.9 kg    Examination: Blood pressure (!) 107/57, pulse (!) 52, temperature (!) 97.4 F (36.3 C), temperature source Oral, resp. rate 18, height 5\' 2"  (1.575 m), weight 78.9 kg, SpO2 100 %. Gen:      No acute distress HEENT:  EOMI, sclera anicteric Neck:     No masses; no thyromegaly Lungs:    Clear to auscultation bilaterally; normal respiratory effort CV:         Regular rate and rhythm; no murmurs Abd:      + bowel sounds; soft, non-tender; no palpable masses, no distension Ext:    No edema; adequate peripheral perfusion Skin:      Warm and dry; no rash Neuro: alert and oriented x 3 Psych: normal mood and affect   Labs/imaging reviewed Significant for potassium 3.3,  BUN/creatinine 25/1.18 Lactic acid 0.9, PCT less than 0.10 WBC 5.8, hemoglobin 10.5, platelets 241  CT head, chest abdomen pelvis and cervical spine reviewed with no acute pathology in the abdomen.  There is diffuse bronchial wall thickening consistent with bronchitis in the lungs.  Resolved Hospital Problem list     Assessment & Plan:  Shock, hypotension Unclear etiology.  There is no clear evidence of infection Suspect polypharmacy as she recently started medical relaxants Start empiric antibiotics, follow cultures Wean down peripheral Levophed  Hypertension Hold home blood pressure medication  Best Practice (right click and "Reselect all SmartList Selections" daily)   Diet/type: Regular consistency (see orders) DVT prophylaxis: LMWH GI prophylaxis: N/A Lines: N/A Foley:  N/A Code Status:  full code Last date of multidisciplinary goals of care discussion []   Critical care time:    The patient is critically ill with multiple organ system failure and requires high complexity decision making for assessment and support, frequent evaluation and titration of therapies, advanced monitoring, review of radiographic studies and interpretation of complex data.   Critical Care Time devoted to patient care services, exclusive of separately billable procedures, described in this note is 35 minutes.   Chilton Greathouse MD [facility] See Amion for pager  If no response to pager , please call [phone] until 7pm After 7:00 pm call Elink  [phone] 02/02/2023, 5:05 PM

## 2023-02-02 NOTE — Sepsis Progress Note (Signed)
eLink is following this Code Sepsis. °

## 2023-02-02 NOTE — ED Notes (Signed)
Pt reports she did fall and hit back of head on cabinet. Pt denies LOC. MD notified and verbal orders placed

## 2023-02-02 NOTE — Consult Note (Signed)
PHARMACY CONSULT NOTE  Pharmacy Consult for Electrolyte Monitoring and Replacement   Recent Labs: Potassium (mmol/L)  Date Value  02/02/2023 3.3 (L)  02/20/2013 3.2 (L)   Calcium (mg/dL)  Date Value  40/98/1191 8.2 (L)   Calcium, Total (mg/dL)  Date Value  47/82/9562 10.1   Albumin (g/dL)  Date Value  13/03/6577 3.5  12/27/2022 4.4  02/20/2013 4.5   Sodium (mmol/L)  Date Value  02/02/2023 137  02/20/2013 139   Assessment: Patient is an 82 y/o F with medical history including HTN, GERD, HLD, obesity presenting with near syncope and hypotension. Patient is status post fluid resuscitation in the emergency department but is requiring vasopressors for hemodynamic support. Lactate is normal. Pharmacy consulted to assist with electrolyte monitoring and replacement as indicated.  Scr 1.18 appears consistent w/ baseline  Diet: Regular  Goal of Therapy:  Electrolytes within normal limits  Plan:  --K 3.3, will order Kcl 40 mEq PO x 1 dose --Follow-up electrolytes with AM labs tomorrow  Tressie Ellis 02/02/2023 4:23 PM

## 2023-02-02 NOTE — Progress Notes (Signed)
PCCM note  Patient has been weaned off peripheral pressors and stable Will change bed status to stepdown and ask hospitalist service to pick up tomorrow.  Chilton Greathouse MD [facility] See Amion for pager  If no response to pager , please call [phone] until 7pm After 7:00 pm call Elink  [phone] 02/02/2023, 5:07 PM

## 2023-02-02 NOTE — ED Provider Notes (Signed)
[facility] Provider Note    Event Date/Time   First MD Initiated Contact with Patient 02/02/23 1147     (approximate)   History   Near Syncope   HPI  Deborah Hurst is a 82 y.o. female past medical history significant for hypertension, GERD, hyperlipidemia, obesity GERD, who presents to the emergency department following an episode of passing out and falling.  States that she went to get up this morning and had felt very lightheaded and dizzy and had a fall.  States that she had a 2 weeks ago as well, seen at outpatient visit yesterday and was started on tizanidine.  Has a history of high blood pressure and took all of her blood pressure medications this morning.  Worsening episode of passing out today.  When she arrived to the emergency department patient significantly hypotensive.  Denies any chest pain or shortness of breath.  Denies any nausea vomiting or diarrhea.  Denies any constipation.  No blood in her stool.  Denies any dysuria, urinary urgency or frequency.     Physical Exam   Triage Vital Signs: ED Triage Vitals  Enc Vitals Group     BP 02/02/23 1137 (!) 74/41     Pulse Rate 02/02/23 1137 (!) 58     Resp 02/02/23 1137 16     Temp 02/02/23 1137 97.9 F (36.6 C)     Temp Source 02/02/23 1137 Oral     SpO2 02/02/23 1137 97 %     Weight 02/02/23 1139 174 lb (78.9 kg)     Height 02/02/23 1139 5\' 2"  (1.575 m)     Head Circumference --      Peak Flow --      Pain Score 02/02/23 1139 0     Pain Loc --      Pain Edu? --      Excl. in GC? --     Most recent vital signs: Vitals:   02/02/23 1540 02/02/23 1550  BP: (!) 98/55 106/78  Pulse: (!) 57 62  Resp:    Temp:    SpO2: 98% 94%    Physical Exam Constitutional:      Appearance: She is well-developed. She is ill-appearing.  HENT:     Head: Atraumatic.  Eyes:     Conjunctiva/sclera: Conjunctivae normal.  Cardiovascular:     Rate and Rhythm: Regular rhythm.  Pulmonary:      Effort: No respiratory distress.     Breath sounds: No wheezing.  Abdominal:     General: There is no distension.     Tenderness: There is no abdominal tenderness. There is no guarding.  Musculoskeletal:        General: Normal range of motion.     Cervical back: Normal range of motion.     Right lower leg: No edema.     Left lower leg: No edema.  Skin:    General: Skin is warm.  Neurological:     General: No focal deficit present.     Mental Status: She is alert. Mental status is at baseline.  Psychiatric:        Mood and Affect: Mood normal.     IMPRESSION / MDM / ASSESSMENT AND PLAN / ED COURSE  I reviewed the triage vital signs and the nursing notes.  Differential diagnosis including sepsis, dehydration, electrolyte abnormality, polypharmacy, intracranial hemorrhage, ACS, dysrhythmia  Felt that 30 cc/kg of IV fluids may be detrimental to the patient, possible cardiogenic, will give  1 L of IV fluids and reevaluate.  Blood cultures obtained.  Will obtain a lactic acid.  After 1 L of IV fluids patient continued to have significant hypotension and had a systolic blood pressure in the 60s-70s, started on peripheral Levophed, given another liter of IV fluids.  Started on antibiotics to cover unknown source with cefepime and vancomycin.  Improvement of blood pressure with Levophed.  EKG  I, Corena Herter, the attending physician, personally viewed and interpreted this ECG.  Read as STEMI, disagree given that the patient has a significantly low blood pressure.  Diffuse ST changes.  Normal intervals.  Repeat EKG showed normal sinus rhythm.  Improvement of diffuse ST changes given improvement of her blood pressure.  No significant ST elevation or signs of acute STEMI.  No tachycardic or bradycardic dysrhythmias while on cardiac telemetry.  RADIOLOGY I independently reviewed imaging, my interpretation of imaging: CT head, no signs of acute intracranial hemorrhage or infarction.  CT  chest abdomen pelvis with no acute findings.  Read as no acute findings  LABS (all labs ordered are listed, but only abnormal results are displayed) Labs interpreted as -    Labs Reviewed  CBC - Abnormal; Notable for the following components:      Result Value   RBC 3.40 (*)    Hemoglobin 10.5 (*)    HCT 31.6 (*)    All other components within normal limits  APTT - Abnormal; Notable for the following components:   aPTT 21 (*)    All other components within normal limits  URINALYSIS, W/ REFLEX TO CULTURE (INFECTION SUSPECTED) - Abnormal; Notable for the following components:   Color, Urine YELLOW (*)    APPearance CLEAR (*)    Leukocytes,Ua MODERATE (*)    All other components within normal limits  COMPREHENSIVE METABOLIC PANEL - Abnormal; Notable for the following components:   Potassium 3.3 (*)    Glucose, Bld 141 (*)    BUN 25 (*)    Creatinine, Ser 1.18 (*)    Calcium 8.2 (*)    Total Protein 5.8 (*)    GFR, Estimated 47 (*)    All other components within normal limits  SARS CORONAVIRUS 2 BY RT PCR  CULTURE, BLOOD (ROUTINE X 2)  CULTURE, BLOOD (ROUTINE X 2)  LACTIC ACID, PLASMA  LACTIC ACID, PLASMA  PROTIME-INR  LIPASE, BLOOD  BRAIN NATRIURETIC PEPTIDE  PROCALCITONIN  TROPONIN I (HIGH SENSITIVITY)  TROPONIN I (HIGH SENSITIVITY)     MDM    On reevaluation no obvious infectious process.  Initial lactic acid within normal limits.  No signs of urinary tract infection.  Initial troponin is negative and repeat is negative have a low suspicion for ACS.  No shortness of breath have a low suspicion for pulmonary embolism.  Chest x-ray with no focal findings of pneumonia.  No findings of intra-abdominal process.  Consulted ICU for hypotension requiring peripheral Levophed.  Possible polypharmacy given new muscle relaxer.  PROCEDURES  Critical Care performed: yes  .Critical Care  Performed by: Corena Herter, MD Authorized by: Corena Herter, MD   Critical care  provider statement:    Critical care time (minutes):  45   Critical care time was exclusive of:  Separately billable procedures and treating other patients   Critical care was necessary to treat or prevent imminent or life-threatening deterioration of the following conditions:  Shock   Critical care was time spent personally by me on the following activities:  Development of treatment plan with  patient or surrogate, discussions with consultants, evaluation of patient's response to treatment, examination of patient, ordering and review of laboratory studies, ordering and review of radiographic studies, ordering and performing treatments and interventions, pulse oximetry, re-evaluation of patient's condition and review of old charts   Patient's presentation is most consistent with acute presentation with potential threat to life or bodily function.   MEDICATIONS ORDERED IN ED: Medications  norepinephrine (LEVOPHED) 4mg  in (0.016 mg/mL) premix infusion (0 mcg/min Intravenous Stopped 02/02/23 1602)  lactated ringers infusion (has no administration in time range)  sodium chloride 0.9 % bolus 1,000 mL (0 mLs Intravenous Stopped 02/02/23 1301)  sodium chloride 0.9 % bolus 1,000 mL (0 mLs Intravenous Stopped 02/02/23 1256)  ondansetron (ZOFRAN) injection 4 mg (4 mg Intravenous Given 02/02/23 1257)  sodium chloride 0.9 % bolus 1,000 mL (1,000 mLs Intravenous New Bag/Given 02/02/23 1343)  ceFEPIme (MAXIPIME) 2 g in sodium chloride 0.9 % 100 mL IVPB (0 g Intravenous Stopped 02/02/23 1446)  vancomycin (VANCOCIN) IVPB 1000 mg/200 mL premix (1,000 mg Intravenous New Bag/Given 02/02/23 1459)  iohexol (OMNIPAQUE) 300 MG/ML solution 100 mL (100 mLs Intravenous Contrast Given 02/02/23 1318)    FINAL CLINICAL IMPRESSION(S) / ED DIAGNOSES   Final diagnoses:  Syncope, unspecified syncope type  Hypotension, unspecified hypotension type  Fall, initial encounter  Injury of head, initial encounter     Rx / DC  Orders   ED Discharge Orders     None        Note:  This document was prepared using Dragon voice recognition software and may include unintentional dictation errors.   Corena Herter, MD 02/02/23 1610

## 2023-02-02 NOTE — ED Notes (Signed)
Pt roomed. EKG was abnormal and shown to EDP and CN was called for room. Wallace Cullens top, blue top and red top tubes sent with basic labs.

## 2023-02-02 NOTE — Progress Notes (Signed)
CODE SEPSIS - PHARMACY COMMUNICATION  **Broad Spectrum Antibiotics should be administered within 1 hour of Sepsis diagnosis**  Time Code Sepsis Called/Page Received: 1303  Antibiotics Ordered: vancomycin and cefepime  Time of 1st antibiotic administration: 1342  Additional action taken by pharmacy: none  If necessary, Name of Provider/Nurse Contacted: n/a     Elliot Gurney, PharmD, BCPS Clinical Pharmacist  02/02/2023 1:04 PM

## 2023-02-03 ENCOUNTER — Other Ambulatory Visit: Payer: Medicare Other

## 2023-02-03 DIAGNOSIS — R55 Syncope and collapse: Secondary | ICD-10-CM | POA: Diagnosis not present

## 2023-02-03 DIAGNOSIS — I959 Hypotension, unspecified: Secondary | ICD-10-CM | POA: Diagnosis present

## 2023-02-03 LAB — RENAL FUNCTION PANEL
Albumin: 3.3 g/dL — ABNORMAL LOW (ref 3.5–5.0)
Anion gap: 1 — ABNORMAL LOW (ref 5–15)
BUN: 18 mg/dL (ref 8–23)
CO2: 28 mmol/L (ref 22–32)
Calcium: 8.1 mg/dL — ABNORMAL LOW (ref 8.9–10.3)
Chloride: 111 mmol/L (ref 98–111)
Creatinine, Ser: 0.98 mg/dL (ref 0.44–1.00)
GFR, Estimated: 58 mL/min — ABNORMAL LOW (ref >60)
Glucose, Bld: 97 mg/dL (ref 70–99)
Phosphorus: 2.8 mg/dL (ref 2.5–4.6)
Potassium: 3.8 mmol/L (ref 3.5–5.1)
Sodium: 140 mmol/L (ref 135–145)

## 2023-02-03 LAB — BASIC METABOLIC PANEL
Anion gap: 1 — ABNORMAL LOW (ref 5–15)
BUN: 18 mg/dL (ref 8–23)
CO2: 28 mmol/L (ref 22–32)
Calcium: 8.2 mg/dL — ABNORMAL LOW (ref 8.9–10.3)
Chloride: 111 mmol/L (ref 98–111)
Creatinine, Ser: 0.96 mg/dL (ref 0.44–1.00)
GFR, Estimated: 60 mL/min — ABNORMAL LOW (ref >60)
Glucose, Bld: 95 mg/dL (ref 70–99)
Potassium: 3.9 mmol/L (ref 3.5–5.1)
Sodium: 140 mmol/L (ref 135–145)

## 2023-02-03 LAB — CBC
HCT: 32.4 % — ABNORMAL LOW (ref 36.0–46.0)
Hemoglobin: 10.7 g/dL — ABNORMAL LOW (ref 12.0–15.0)
MCH: 30.6 pg (ref 26.0–34.0)
MCHC: 33 g/dL (ref 30.0–36.0)
MCV: 92.6 fL (ref 80.0–100.0)
Platelets: 252 10*3/uL (ref 150–400)
RBC: 3.5 MIL/uL — ABNORMAL LOW (ref 3.87–5.11)
RDW: 12.5 % (ref 11.5–15.5)
WBC: 8.4 10*3/uL (ref 4.0–10.5)
nRBC: 0 % (ref 0.0–0.2)

## 2023-02-03 LAB — PROCALCITONIN: Procalcitonin: <0.1 ng/mL

## 2023-02-03 LAB — LACTIC ACID, PLASMA: Lactic Acid, Venous: 1.4 mmol/L (ref 0.5–1.9)

## 2023-02-03 MED ORDER — CHLORHEXIDINE GLUCONATE CLOTH 2 % EX PADS
6.0000 | MEDICATED_PAD | Freq: Every day | CUTANEOUS | Status: DC
  Administered 2023-02-03: 6 via TOPICAL

## 2023-02-03 NOTE — Progress Notes (Signed)
Patient arrived to room via w/c from ICU in stable condition. Pt ambulated with steady gait from w/c to bed.

## 2023-02-03 NOTE — Discharge Instructions (Signed)
Agency Name: [facility] Address: [address], [place], [place] [address] Phone: [phone] Website: [email or web address] Service(s) Offered: Housing services, self-sufficiency, congregate meal  program, and individual development account program.  Agency Name: [facility] Address: [address], [place], [place] [address] Phone: [phone] Email: [email or web address] Website: [email or web address] Service(s) Offered: Housing the homeless, feeding the hungry, community  kitchen & food pantry, job and education related services.  Agency Name: [facility] Address: [address], [place], [place] [address] Phone: [phone] Email: [email or web address] Service(s) Offered: Counseling, problem pregnancy, advocacy for Hispanics,  limited emergency financial assistance.  Agency Name: [facility] [employer] [facility] Address: [address], [place], [place] [address] Phone: [phone] Website: [email or web address] Service(s) Offered: Child support services; child welfare services; SNAPS;  Medicaid; work first family assistance; and aid with fuel,  rent, food and medicine.  Agency Name: [facility] Address: [address], [place], [place] [address] Phone: [phone] or [phone] Email: [email or web address] Service(s) Offered: Family services and transient assistance; emergency food,  fuel, clothing, limited furniture, utilities; budget counseling,  general counseling; give a kid a coat; thrift store; Christmas  food and toys. Utility assistance, food pantry, rental  assistance, life sustaining medicine  

## 2023-02-03 NOTE — Progress Notes (Signed)
Patient transferred to room 113 with all belongings, in stable condition, and in wheelchair.

## 2023-02-03 NOTE — Plan of Care (Signed)
Continuing with plan of care. 

## 2023-02-03 NOTE — TOC CM/SW Note (Signed)
Patient flagged for SDOH utilities. Utility resources added to be included on AVS.   Deborah Ramus, LCSW [phone]

## 2023-02-03 NOTE — Progress Notes (Addendum)
Progress Note    Deborah Hurst  ZOX:[id number] DOB: [birth year]  DOA: 02/02/2023 PCP: Sherron Monday, MD      Brief Narrative:    Medical records reviewed and are as summarized below:  Deborah Hurst is a 82 y.o. female with medical history significant for hypertension on multiple antihypertensives, hyperlipidemia, obesity, GERD, who presented to the hospital with near syncope and fall at home that was witnessed by her family.  When EMS arrived her BP was 80/40.  She had a mechanical fall about 2 weeks prior to admission and she developed back pain and knee pain. She said she was recently started tizanidine on 02/01/2023 when she presented to the ED with back pain and left knee pain. She only took 2 doses of tizanidine.  She said she felt drowsy after taking the medicine.  She was admitted to the ICU for refractory hypotension.  She was treated with IV fluids and IV Levophed infusion.  She was also given empiric IV antibiotics.    Assessment/Plan:   Principal Problem:   Near syncope Active Problems:   Hypotension    Body mass index is 31.85 kg/m.  (Obesity)   Near syncope, s/p fall at home: Probably from tizanidine and hypotension.   Shock and hypotension: Likely from combination of antihypertensives and tizanidine.  No clear evidence of infection at this time.  Continue empiric antibiotics for now.  Follow-up blood cultures.  Discontinue IV antibiotics if blood cultures are negative by tomorrow.   History of hypertension: She takes amlodipine-benazepril, carvedilol, chlorthalidone and clonidine.  Antihypertensives have been held for now.     Back pain, knee pain and carpal tunnel syndrome: Tizanidine has been discontinued.  Tylenol as needed for pain.   Transfer from stepdown unit to MedSurg.   Diet Order             Diet regular Room service appropriate? Yes; Fluid consistency: Thin  Diet effective now                             Consultants: Intensivist  Procedures: None    Medications:    Chlorhexidine Gluconate Cloth  6 each Topical Daily   enoxaparin (LOVENOX) injection  40 mg Subcutaneous Q24H   Continuous Infusions:  sodium chloride Stopped (02/02/23 1753)   azithromycin Stopped (02/02/23 1821)   cefTRIAXone (ROCEPHIN)  IV Stopped (02/03/23 0543)     Anti-infectives (From admission, onward)    Start     Dose/Rate Route Frequency Ordered Stop   02/03/23 0600  cefTRIAXone (ROCEPHIN) 2 g in sodium chloride 0.9 % 100 mL IVPB        2 g 200 mL/hr over 30 Minutes Intravenous Every 24 hours 02/02/23 1633 02/08/23 0559   02/02/23 1800  azithromycin (ZITHROMAX) 500 mg in sodium chloride 0.9 % 250 mL IVPB        500 mg 250 mL/hr over 60 Minutes Intravenous Every 24 hours 02/02/23 1633 02/07/23 1759   02/02/23 1315  ceFEPIme (MAXIPIME) 2 g in sodium chloride 0.9 % 100 mL IVPB        2 g 200 mL/hr over 30 Minutes Intravenous  Once 02/02/23 1300 02/02/23 1446   02/02/23 1315  vancomycin (VANCOCIN) IVPB 1000 mg/200 mL premix        1,000 mg 200 mL/hr over 60 Minutes Intravenous  Once 02/02/23 1300 02/02/23 1459  Family Communication/Anticipated D/C date and plan/Code Status   DVT prophylaxis: enoxaparin (LOVENOX) injection 40 mg Start: 02/02/23 2200 SCDs Start: 02/02/23 1621     Code Status: Full Code  Family Communication: Discussed with Lauris Poag, granddaughter, at the bedside Disposition Plan: Plan to discharge home tomorrow   Status is: Inpatient Remains inpatient appropriate because: Monitor BP closely.  Follow-up blood cultures.       Subjective:   Interval events noted.  She complains of back and knee pain.  No shortness of breath, chest pain, fever, cough, abdominal pain.  Objective:    Vitals:   02/03/23 1000 02/03/23 1100 02/03/23 1200 02/03/23 1322  BP: 123/70 (!) 141/74 123/76 (!) 144/77  Pulse: 67 68 70 64  Resp: 19 12 18 16    Temp:   98.3 F (36.8 C) 98.1 F (36.7 C)  TempSrc:   Oral   SpO2: 100% 100% 99% 97%  Weight:      Height:       No data found.   Intake/Output Summary (Last 24 hours) at 02/03/2023 1614 Last data filed at 02/03/2023 1200 Gross per 24 hour  Intake 2046.36 ml  Output 1750 ml  Net 296.36 ml   Filed Weights   02/02/23 1139 02/03/23 0137  Weight: 78.9 kg 79 kg    Exam:  GEN: NAD SKIN: Warm and dry EYES: No pallor or icterus ENT: MMM CV: RRR PULM: CTA B ABD: soft, obese, NT, +BS CNS: AAO x 3, non focal EXT: No edema or tenderness        Data Reviewed:   I have personally reviewed following labs and imaging studies:  Labs: Labs show the following:   Basic Metabolic Panel: Recent Labs  Lab 02/02/23 1149 02/03/23 0431 02/03/23 0433  NA 137 140 140  K 3.3* 3.9 3.8  CL 107 111 111  CO2 24 28 28   GLUCOSE 141* 95 97  BUN 25* 18 18  CREATININE 1.18* 0.96 0.98  CALCIUM 8.2* 8.2* 8.1*  PHOS  --   --  2.8   GFR Estimated Creatinine Clearance: 44.6 mL/min (by C-G formula based on SCr of 0.98 mg/dL). Liver Function Tests: Recent Labs  Lab 02/02/23 1149 02/03/23 0433  AST 18  --   ALT 13  --   ALKPHOS 39  --   BILITOT 0.5  --   PROT 5.8*  --   ALBUMIN 3.5 3.3*   Recent Labs  Lab 02/02/23 1148  LIPASE 31   No results for input(s): "AMMONIA" in the last 168 hours. Coagulation profile Recent Labs  Lab 02/02/23 1148  INR 1.1    CBC: Recent Labs  Lab 02/02/23 1148 02/03/23 0431  WBC 5.8 8.4  HGB 10.5* 10.7*  HCT 31.6* 32.4*  MCV 92.9 92.6  PLT 241 252   Cardiac Enzymes: No results for input(s): "CKTOTAL", "CKMB", "CKMBINDEX", "TROPONINI" in the last 168 hours. BNP (last 3 results) No results for input(s): "PROBNP" in the last 8760 hours. CBG: No results for input(s): "GLUCAP" in the last 168 hours. D-Dimer: No results for input(s): "DDIMER" in the last 72 hours. Hgb A1c: No results for input(s): "HGBA1C" in the last 72  hours. Lipid Profile: No results for input(s): "CHOL", "HDL", "LDLCALC", "TRIG", "CHOLHDL", "LDLDIRECT" in the last 72 hours. Thyroid function studies: No results for input(s): "TSH", "T4TOTAL", "T3FREE", "THYROIDAB" in the last 72 hours.  Invalid input(s): "FREET3" Anemia work up: No results for input(s): "VITAMINB12", "FOLATE", "FERRITIN", "TIBC", "IRON", "RETICCTPCT" in the last 72 hours. Sepsis  Labs: Recent Labs  Lab 02/02/23 1148 02/02/23 1348 02/02/23 1757 02/02/23 2112 02/03/23 0018 02/03/23 0431  PROCALCITON  --  <0.10  --   --   --  <0.10  WBC 5.8  --   --   --   --  8.4  LATICACIDVEN 1.7 0.9 0.7 1.0 1.4  --     Microbiology Recent Results (from the past 240 hour(s))  Blood Culture (routine x 2)     Status: None (Preliminary result)   Collection Time: 02/02/23 11:59 AM   Specimen: BLOOD RIGHT ARM  Result Value Ref Range Status   Specimen Description BLOOD RIGHT ARM  Final   Special Requests   Final    BOTTLES DRAWN AEROBIC AND ANAEROBIC Blood Culture adequate volume   Culture   Final    NO GROWTH < 24 HOURS Performed at [facility], [address], [place], [place] [address]    Report Status PENDING  Incomplete  Blood Culture (routine x 2)     Status: None (Preliminary result)   Collection Time: 02/02/23 12:00 PM   Specimen: BLOOD LEFT ARM  Result Value Ref Range Status   Specimen Description BLOOD LEFT ARM  Final   Special Requests   Final    BOTTLES DRAWN AEROBIC AND ANAEROBIC Blood Culture results may not be optimal due to an excessive volume of blood received in culture bottles   Culture   Final    NO GROWTH < 24 HOURS Performed at [facility], [address] [place], [place] [address]    Report Status PENDING  Incomplete  SARS Coronavirus 2 by RT PCR (hospital order, performed in [facility] hospital lab) *cepheid single result test* Anterior Nasal Swab     Status: None   Collection Time: 02/02/23  3:06 PM   Specimen: Anterior  Nasal Swab  Result Value Ref Range Status   SARS Coronavirus 2 by RT PCR NEGATIVE NEGATIVE Final    Comment: (NOTE) SARS-CoV-2 target nucleic acids are NOT DETECTED.  The SARS-CoV-2 RNA is generally detectable in upper and lower respiratory specimens during the acute phase of infection. The lowest concentration of SARS-CoV-2 viral copies this assay can detect is 250 copies / mL. A negative result does not preclude SARS-CoV-2 infection and should not be used as the sole basis for treatment or other patient management decisions.  A negative result may occur with improper specimen collection / handling, submission of specimen other than nasopharyngeal swab, presence of viral mutation(s) within the areas targeted by this assay, and inadequate number of viral copies (<250 copies / mL). A negative result must be combined with clinical observations, patient history, and epidemiological information.  Fact Sheet for Patients:   RoadLapTop.co.za  Fact Sheet for Healthcare Providers: http://kim-miller.com/  This test is not yet approved or  cleared by the [place] FDA and has been authorized for detection and/or diagnosis of SARS-CoV-2 by FDA under an Emergency Use Authorization (EUA).  This EUA will remain in effect (meaning this test can be used) for the duration of the COVID-19 declaration under Section 564(b)(1) of the Act, 21 U.S.C. section 360bbb-3(b)(1), unless the authorization is terminated or revoked sooner.  Performed at [facility], [address] [place], [place] [address]   MRSA Next Gen by PCR, Nasal     Status: None   Collection Time: 02/02/23  4:57 PM   Specimen: Nasal Mucosa; Nasal Swab  Result Value Ref Range Status   MRSA by PCR Next Gen  NOT DETECTED NOT DETECTED Final    Comment: (NOTE) The GeneXpert MRSA Assay (FDA approved for NASAL specimens only), is one component of a comprehensive MRSA colonization  surveillance program. It is not intended to diagnose MRSA infection nor to guide or monitor treatment for MRSA infections. Test performance is not FDA approved in patients less than 76 years old. Performed at [facility], [address] [place], [place] [address]     Procedures and diagnostic studies:  CT CHEST ABDOMEN PELVIS W CONTRAST  Result Date: 02/02/2023 CLINICAL DATA:  Trauma, fall, sepsis, hypotension EXAM: CT CHEST, ABDOMEN, AND PELVIS WITH CONTRAST TECHNIQUE: Multidetector CT imaging of the chest, abdomen and pelvis was performed following the standard protocol during bolus administration of intravenous contrast. RADIATION DOSE REDUCTION: This exam was performed according to the departmental dose-optimization program which includes automated exposure control, adjustment of the mA and/or kV according to patient size and/or use of iterative reconstruction technique. CONTRAST:  OMNIPAQUE IOHEXOL 300 MG/ML  SOLN COMPARISON:  06/08/2020 FINDINGS: CT CHEST FINDINGS Cardiovascular: Aortic atherosclerosis. Normal heart size. No pericardial effusion. Mediastinum/Nodes: No enlarged mediastinal, hilar, or axillary lymph nodes. Thyroid gland, trachea, and esophagus demonstrate no significant findings. Lungs/Pleura: Diffuse bilateral bronchial wall thickening. Unchanged, benign nodule of the posterolateral segment right middle lobe measuring 0.6 cm, for which no further follow-up or characterization is required (series 4, image 66). No pleural effusion or pneumothorax. Musculoskeletal: No chest wall abnormality. No acute osseous findings. CT ABDOMEN PELVIS FINDINGS Hepatobiliary: No focal liver abnormality is seen. Status post cholecystectomy. Unchanged mild biliary dilatation. Pancreas: Unremarkable. No pancreatic ductal dilatation or surrounding inflammatory changes. Spleen: Normal in size without significant abnormality. Adrenals/Urinary Tract: Unchanged, definitively benign fat  containing left adrenal adenoma, for which no further follow-up or characterization is required. Kidneys are normal, without renal calculi, solid lesion, or hydronephrosis. Bladder is unremarkable. Stomach/Bowel: Stomach is within normal limits. Appendix appears normal. No evidence of bowel wall thickening, distention, or inflammatory changes. Pancolonic diverticulosis. Vascular/Lymphatic: Aortic atherosclerosis. No enlarged abdominal or pelvic lymph nodes. Reproductive: Status post hysterectomy. Other: No abdominal wall hernia or abnormality. Trace free fluid in the low pelvis (series 2, image 75) Musculoskeletal: No acute osseous findings. IMPRESSION: 1. No CT evidence of acute traumatic injury to the chest, abdomen, or pelvis. 2. Trace free fluid in the low pelvis, nonspecific. 3. Colonic diverticulosis. 4. Diffuse bilateral bronchial wall thickening, consistent with nonspecific infectious or inflammatory bronchitis. Aortic Atherosclerosis (ICD10-I70.0). Electronically Signed   By: Jearld Lesch M.D.   On: 02/02/2023 14:07   CT HEAD WO CONTRAST ( )  Result Date: 02/02/2023 CLINICAL DATA:  Trauma, fall, on blood thinners, hypotension EXAM: CT HEAD WITHOUT CONTRAST CT CERVICAL SPINE WITHOUT CONTRAST TECHNIQUE: Multidetector CT imaging of the head and cervical spine was performed following the standard protocol without intravenous contrast. Multiplanar CT image reconstructions of the cervical spine were also generated. RADIATION DOSE REDUCTION: This exam was performed according to the departmental dose-optimization program which includes automated exposure control, adjustment of the mA and/or kV according to patient size and/or use of iterative reconstruction technique. COMPARISON:  None Available. FINDINGS: CT HEAD FINDINGS Brain: No evidence of acute infarction, hemorrhage, hydrocephalus, extra-axial collection or mass lesion/mass effect. Periventricular and deep white matter hypodensity. Vascular: No  hyperdense vessel or unexpected calcification. Skull: Normal. Negative for fracture or focal lesion. Sinuses/Orbits: No acute finding. Other: None. CT CERVICAL SPINE FINDINGS Alignment: Straightening of the normal cervical lordosis. Skull base and vertebrae: No acute fracture. No primary bone  lesion or focal pathologic process. Soft tissues and spinal canal: No prevertebral fluid or swelling. No visible canal hematoma. Disc levels: Mild-to-moderate multilevel disc space height loss and osteophytosis. Upper chest: Negative. Other: None. IMPRESSION: 1. No acute intracranial pathology. Small-vessel white matter disease. 2. No fracture or static subluxation of the cervical spine. 3. 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 Electronically Signed   By: Jearld Lesch M.D.   On: 02/02/2023 13:47   CT Cervical Spine Wo Contrast  Result Date: 02/02/2023 CLINICAL DATA:  Trauma, fall, on blood thinners, hypotension EXAM: CT HEAD WITHOUT CONTRAST CT CERVICAL SPINE WITHOUT CONTRAST TECHNIQUE: Multidetector CT imaging of the head and cervical spine was performed following the standard protocol without intravenous contrast. Multiplanar CT image reconstructions of the cervical spine were also generated. RADIATION DOSE REDUCTION: This exam was performed according to the departmental dose-optimization program which includes automated exposure control, adjustment of the mA and/or kV according to patient size and/or use of iterative reconstruction technique. COMPARISON:  None Available. FINDINGS: CT HEAD FINDINGS Brain: No evidence of acute infarction, hemorrhage, hydrocephalus, extra-axial collection or mass lesion/mass effect. Periventricular and deep white matter hypodensity. Vascular: No hyperdense vessel or unexpected calcification. Skull: Normal. Negative for fracture or focal lesion. Sinuses/Orbits: No acute finding. Other: None. CT CERVICAL SPINE FINDINGS Alignment: Straightening of the normal cervical  lordosis. Skull base and vertebrae: No acute fracture. No primary bone lesion or focal pathologic process. Soft tissues and spinal canal: No prevertebral fluid or swelling. No visible canal hematoma. Disc levels: Mild-to-moderate multilevel disc space height loss and osteophytosis. Upper chest: Negative. Other: None. IMPRESSION: 1. No acute intracranial pathology. Small-vessel white matter disease. 2. No fracture or static subluxation of the cervical spine. 3. 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 Electronically Signed   By: Jearld Lesch M.D.   On: 02/02/2023 13:47   DG Chest Port 1 View  Result Date: 02/02/2023 CLINICAL DATA:  Syncope EXAM: PORTABLE CHEST 1 VIEW COMPARISON:  06/08/2020 FINDINGS: The lungs are clear without focal pneumonia, edema, pneumothorax or pleural effusion. The cardiopericardial silhouette is within normal limits for size. No acute bony abnormality. Telemetry leads overlie the chest. IMPRESSION: No active disease. Electronically Signed   By: Kennith Center M.D.   On: 02/02/2023 12:36               LOS: 1 day   Etsuko Dierolf  [facility]   Pager on [email or web address]. If 7PM-7AM, please contact night-coverage at [email or web address]     02/03/2023, 4:14 PM

## 2023-02-03 NOTE — Progress Notes (Signed)
Report given to receiving nurse, Tiffanie.

## 2023-02-04 DIAGNOSIS — I959 Hypotension, unspecified: Secondary | ICD-10-CM | POA: Diagnosis not present

## 2023-02-04 DIAGNOSIS — R55 Syncope and collapse: Secondary | ICD-10-CM | POA: Diagnosis not present

## 2023-02-04 LAB — CULTURE, BLOOD (ROUTINE X 2): Special Requests: ADEQUATE

## 2023-02-04 LAB — RENAL FUNCTION PANEL
Albumin: 3.5 g/dL (ref 3.5–5.0)
Anion gap: 7 (ref 5–15)
BUN: 14 mg/dL (ref 8–23)
CO2: 26 mmol/L (ref 22–32)
Calcium: 9 mg/dL (ref 8.9–10.3)
Chloride: 107 mmol/L (ref 98–111)
Creatinine, Ser: 0.96 mg/dL (ref 0.44–1.00)
GFR, Estimated: 60 mL/min — ABNORMAL LOW (ref >60)
Glucose, Bld: 89 mg/dL (ref 70–99)
Phosphorus: 3.9 mg/dL (ref 2.5–4.6)
Potassium: 3.6 mmol/L (ref 3.5–5.1)
Sodium: 140 mmol/L (ref 135–145)

## 2023-02-04 LAB — CBC
HCT: 33.8 % — ABNORMAL LOW (ref 36.0–46.0)
Hemoglobin: 11.4 g/dL — ABNORMAL LOW (ref 12.0–15.0)
MCH: 30.5 pg (ref 26.0–34.0)
MCHC: 33.7 g/dL (ref 30.0–36.0)
MCV: 90.4 fL (ref 80.0–100.0)
Platelets: 283 10*3/uL (ref 150–400)
RBC: 3.74 MIL/uL — ABNORMAL LOW (ref 3.87–5.11)
RDW: 12.4 % (ref 11.5–15.5)
WBC: 7.8 10*3/uL (ref 4.0–10.5)
nRBC: 0 % (ref 0.0–0.2)

## 2023-02-04 LAB — PROCALCITONIN: Procalcitonin: <0.1 ng/mL

## 2023-02-04 MED ORDER — ACETAMINOPHEN 325 MG PO TABS
650.0000 mg | ORAL_TABLET | Freq: Four times a day (QID) | ORAL | Status: DC | PRN
  Administered 2023-02-04: 650 mg via ORAL
  Filled 2023-02-04: qty 2

## 2023-02-04 NOTE — Discharge Summary (Signed)
Physician Discharge Summary   Patient: Deborah Hurst MRN: [id number] DOB: 12/22/1940  Admit date:     02/02/2023  Discharge date: 02/04/2023  Discharge Physician: Lurene Shadow   PCP: Sherron Monday, MD   Recommendations at discharge:   Follow-up with PCP in 1 week  Discharge Diagnoses: Principal Problem:   Near syncope Active Problems:   Hypotension  Resolved Problems:   * No resolved hospital problems. *  Hospital Course:  Deborah Hurst is a 82 y.o. female with medical history significant for hypertension on multiple antihypertensives, hyperlipidemia, obesity, GERD, who presented to the hospital with near syncope and fall at home that was witnessed by her family.  When EMS arrived her BP was 80/40.  She had a mechanical fall about 2 weeks prior to admission and she developed back pain and knee pain. She said she was recently started tizanidine on 02/01/2023 when she presented to the ED with back pain and left knee pain. She only took 2 doses of tizanidine.  She said she felt drowsy after taking the medicine.   She was admitted to the ICU for refractory hypotension.  She was treated with IV fluids and IV Levophed infusion.  She was also given empiric IV antibiotics.    Assessment and Plan:  Near syncope, s/p fall at home: Probably from tizanidine and hypotension. She has been ambulated independently to the bathroom in the hospital.    Shock and hypotension: Likely from combination of antihypertensives and tizanidine.  No clear evidence of infection at this time.  No growth on blood cultures.  No fever or leukocytosis.  Discontinue IV antibiotics.    Hypertension: BP is trending up again.  Resume amlodipine-benazepril, chlorthalidone and clonidine.  It appears she has been on amlodipine-benazepril and clonidine for right long time.  Discontinue carvedilol.     Back pain, knee pain and carpal tunnel syndrome: Tizanidine has been discontinued.  Tylenol as needed for  pain.    Her condition is improved and she is deemed stable for discharge home today.      Consultants: Intensivist Procedures performed: None Disposition: Home Diet recommendation:  Discharge Diet Orders (From admission, onward)     Start     Ordered   02/04/23 0000  Diet - low sodium heart healthy        02/04/23 1013           Cardiac diet DISCHARGE MEDICATION: Allergies as of 02/04/2023   No Known Allergies      Medication List     STOP taking these medications    carvedilol 25 MG tablet Commonly known as: COREG   predniSONE 10 MG (21) Tbpk tablet Commonly known as: STERAPRED UNI-PAK 21 TAB   tiZANidine 4 MG tablet Commonly known as: Zanaflex       TAKE these medications    amLODipine-benazepril 10-20 MG capsule Commonly known as: LOTREL Take 1 capsule by mouth daily.   chlorthalidone 25 MG tablet Commonly known as: HYGROTON Take 1 tablet (25 mg total) by mouth daily.   cloNIDine 0.1 MG tablet Commonly known as: CATAPRES Take 1 tablet (0.1 mg total) by mouth at bedtime.   fluticasone 50 MCG/ACT nasal spray Commonly known as: FLONASE   gabapentin 300 MG capsule Commonly known as: NEURONTIN Take 300 mg by mouth 2 (two) times daily.   linaclotide 72 MCG capsule Commonly known as: Linzess TAKE 1 CAPSULE BY MOUTH EVERY DAY IN THE MORNING What changed:  how much to take how to  take this when to take this   meloxicam 15 MG tablet Commonly known as: MOBIC Take 15 mg by mouth daily.   mirtazapine 15 MG tablet Commonly known as: Remeron Take 1 tablet (15 mg total) by mouth at bedtime.   pantoprazole 40 MG tablet Commonly known as: PROTONIX Take by mouth.   rosuvastatin 5 MG tablet Commonly known as: CRESTOR Take 1 tablet (5 mg total) by mouth at bedtime.        Discharge Exam: Filed Weights   02/02/23 1139 02/03/23 0137  Weight: 78.9 kg 79 kg   GEN: NAD SKIN: No rash EYES: EOMI ENT: MMM CV: RRR PULM: CTA B ABD: soft,  obese, NT, +BS CNS: AAO x 3, non focal EXT: No edema or tenderness   Condition at discharge: good  The results of significant diagnostics from this hospitalization (including imaging, microbiology, ancillary and laboratory) are listed below for reference.   Imaging Studies: CT CHEST ABDOMEN PELVIS W CONTRAST  Result Date: 02/02/2023 CLINICAL DATA:  Trauma, fall, sepsis, hypotension EXAM: CT CHEST, ABDOMEN, AND PELVIS WITH CONTRAST TECHNIQUE: Multidetector CT imaging of the chest, abdomen and pelvis was performed following the standard protocol during bolus administration of intravenous contrast. RADIATION DOSE REDUCTION: This exam was performed according to the departmental dose-optimization program which includes automated exposure control, adjustment of the mA and/or kV according to patient size and/or use of iterative reconstruction technique. CONTRAST:  OMNIPAQUE IOHEXOL 300 MG/ML  SOLN COMPARISON:  06/08/2020 FINDINGS: CT CHEST FINDINGS Cardiovascular: Aortic atherosclerosis. Normal heart size. No pericardial effusion. Mediastinum/Nodes: No enlarged mediastinal, hilar, or axillary lymph nodes. Thyroid gland, trachea, and esophagus demonstrate no significant findings. Lungs/Pleura: Diffuse bilateral bronchial wall thickening. Unchanged, benign nodule of the posterolateral segment right middle lobe measuring 0.6 cm, for which no further follow-up or characterization is required (series 4, image 66). No pleural effusion or pneumothorax. Musculoskeletal: No chest wall abnormality. No acute osseous findings. CT ABDOMEN PELVIS FINDINGS Hepatobiliary: No focal liver abnormality is seen. Status post cholecystectomy. Unchanged mild biliary dilatation. Pancreas: Unremarkable. No pancreatic ductal dilatation or surrounding inflammatory changes. Spleen: Normal in size without significant abnormality. Adrenals/Urinary Tract: Unchanged, definitively benign fat containing left adrenal adenoma, for which no  further follow-up or characterization is required. Kidneys are normal, without renal calculi, solid lesion, or hydronephrosis. Bladder is unremarkable. Stomach/Bowel: Stomach is within normal limits. Appendix appears normal. No evidence of bowel wall thickening, distention, or inflammatory changes. Pancolonic diverticulosis. Vascular/Lymphatic: Aortic atherosclerosis. No enlarged abdominal or pelvic lymph nodes. Reproductive: Status post hysterectomy. Other: No abdominal wall hernia or abnormality. Trace free fluid in the low pelvis (series 2, image 75) Musculoskeletal: No acute osseous findings. IMPRESSION: 1. No CT evidence of acute traumatic injury to the chest, abdomen, or pelvis. 2. Trace free fluid in the low pelvis, nonspecific. 3. Colonic diverticulosis. 4. Diffuse bilateral bronchial wall thickening, consistent with nonspecific infectious or inflammatory bronchitis. Aortic Atherosclerosis (ICD10-I70.0). Electronically Signed   By: Jearld Lesch M.D.   On: 02/02/2023 14:07   CT HEAD WO CONTRAST ( )  Result Date: 02/02/2023 CLINICAL DATA:  Trauma, fall, on blood thinners, hypotension EXAM: CT HEAD WITHOUT CONTRAST CT CERVICAL SPINE WITHOUT CONTRAST TECHNIQUE: Multidetector CT imaging of the head and cervical spine was performed following the standard protocol without intravenous contrast. Multiplanar CT image reconstructions of the cervical spine were also generated. RADIATION DOSE REDUCTION: This exam was performed according to the departmental dose-optimization program which includes automated exposure control, adjustment of the mA and/or kV according to  patient size and/or use of iterative reconstruction technique. COMPARISON:  None Available. FINDINGS: CT HEAD FINDINGS Brain: No evidence of acute infarction, hemorrhage, hydrocephalus, extra-axial collection or mass lesion/mass effect. Periventricular and deep white matter hypodensity. Vascular: No hyperdense vessel or unexpected calcification.  Skull: Normal. Negative for fracture or focal lesion. Sinuses/Orbits: No acute finding. Other: None. CT CERVICAL SPINE FINDINGS Alignment: Straightening of the normal cervical lordosis. Skull base and vertebrae: No acute fracture. No primary bone lesion or focal pathologic process. Soft tissues and spinal canal: No prevertebral fluid or swelling. No visible canal hematoma. Disc levels: Mild-to-moderate multilevel disc space height loss and osteophytosis. Upper chest: Negative. Other: None. IMPRESSION: 1. No acute intracranial pathology. Small-vessel white matter disease. 2. No fracture or static subluxation of the cervical spine. 3. 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 Electronically Signed   By: Jearld Lesch M.D.   On: 02/02/2023 13:47   CT Cervical Spine Wo Contrast  Result Date: 02/02/2023 CLINICAL DATA:  Trauma, fall, on blood thinners, hypotension EXAM: CT HEAD WITHOUT CONTRAST CT CERVICAL SPINE WITHOUT CONTRAST TECHNIQUE: Multidetector CT imaging of the head and cervical spine was performed following the standard protocol without intravenous contrast. Multiplanar CT image reconstructions of the cervical spine were also generated. RADIATION DOSE REDUCTION: This exam was performed according to the departmental dose-optimization program which includes automated exposure control, adjustment of the mA and/or kV according to patient size and/or use of iterative reconstruction technique. COMPARISON:  None Available. FINDINGS: CT HEAD FINDINGS Brain: No evidence of acute infarction, hemorrhage, hydrocephalus, extra-axial collection or mass lesion/mass effect. Periventricular and deep white matter hypodensity. Vascular: No hyperdense vessel or unexpected calcification. Skull: Normal. Negative for fracture or focal lesion. Sinuses/Orbits: No acute finding. Other: None. CT CERVICAL SPINE FINDINGS Alignment: Straightening of the normal cervical lordosis. Skull base and vertebrae: No acute  fracture. No primary bone lesion or focal pathologic process. Soft tissues and spinal canal: No prevertebral fluid or swelling. No visible canal hematoma. Disc levels: Mild-to-moderate multilevel disc space height loss and osteophytosis. Upper chest: Negative. Other: None. IMPRESSION: 1. No acute intracranial pathology. Small-vessel white matter disease. 2. No fracture or static subluxation of the cervical spine. 3. 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 Electronically Signed   By: Jearld Lesch M.D.   On: 02/02/2023 13:47   DG Chest Port 1 View  Result Date: 02/02/2023 CLINICAL DATA:  Syncope EXAM: PORTABLE CHEST 1 VIEW COMPARISON:  06/08/2020 FINDINGS: The lungs are clear without focal pneumonia, edema, pneumothorax or pleural effusion. The cardiopericardial silhouette is within normal limits for size. No acute bony abnormality. Telemetry leads overlie the chest. IMPRESSION: No active disease. Electronically Signed   By: Kennith Center M.D.   On: 02/02/2023 12:36   DG Lumbar Spine Complete  Result Date: 02/01/2023 CLINICAL DATA:  Status post fall 2 weeks ago EXAM: LUMBAR SPINE - COMPLETE 4+ VIEW COMPARISON:  None Available. FINDINGS: 5 nonrib bearing lumbar-type vertebral bodies. Vertebral body heights are maintained. No acute fracture. Generalized osteopenia. Grade 1 anterolisthesis of L4 on L5 secondary to facet disease. No spondylolysis. Degenerative disease with disc height loss throughout the lower thoracic spine. Degenerative disease with disc height loss at L4-5 and L5-S1. Bilateral facet arthropathy at L4-5 and L5-S1. SI joints are unremarkable. IMPRESSION: 1. No acute osseous injury of the lumbar spine. 2. Lumbar spine spondylosis as described above. Electronically Signed   By: Elige Ko M.D.   On: 02/01/2023 11:29   DG Knee Complete 4 Views Left  Result Date:  02/01/2023 CLINICAL DATA:  Status post fall 2 weeks ago EXAM: LEFT KNEE - COMPLETE 4+ VIEW COMPARISON:  None  Available. FINDINGS: No acute fracture or dislocation. No aggressive osseous lesion. Normal alignment. 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 Soft tissue are unremarkable. No radiopaque foreign body or soft tissue emphysema. IMPRESSION: 1. No acute osseous injury of the left knee. Electronically Signed   By: Elige Ko M.D.   On: 02/01/2023 11:28    Microbiology: Results for orders placed or performed during the hospital encounter of 02/02/23  Blood Culture (routine x 2)     Status: None (Preliminary result)   Collection Time: 02/02/23 11:59 AM   Specimen: BLOOD RIGHT ARM  Result Value Ref Range Status   Specimen Description BLOOD RIGHT ARM  Final   Special Requests   Final    BOTTLES DRAWN AEROBIC AND ANAEROBIC Blood Culture adequate volume   Culture   Final    NO GROWTH 2 DAYS Performed at [facility], [address], [place], [place] [address]    Report Status PENDING  Incomplete  Blood Culture (routine x 2)     Status: None (Preliminary result)   Collection Time: 02/02/23 12:00 PM   Specimen: BLOOD LEFT ARM  Result Value Ref Range Status   Specimen Description BLOOD LEFT ARM  Final   Special Requests   Final    BOTTLES DRAWN AEROBIC AND ANAEROBIC Blood Culture results may not be optimal due to an excessive volume of blood received in culture bottles   Culture   Final    NO GROWTH 2 DAYS Performed at [facility], [address], [place], [place] [address]    Report Status PENDING  Incomplete  SARS Coronavirus 2 by RT PCR (hospital order, performed in [facility] hospital lab) *cepheid single result test* Anterior Nasal Swab     Status: None   Collection Time: 02/02/23  3:06 PM   Specimen: Anterior Nasal Swab  Result Value Ref Range Status   SARS Coronavirus 2 by RT PCR NEGATIVE NEGATIVE Final    Comment: (NOTE) SARS-CoV-2 target nucleic acids are NOT DETECTED.  The SARS-CoV-2 RNA is generally detectable in upper and  lower respiratory specimens during the acute phase of infection. The lowest concentration of SARS-CoV-2 viral copies this assay can detect is 250 copies / mL. A negative result does not preclude SARS-CoV-2 infection and should not be used as the sole basis for treatment or other patient management decisions.  A negative result may occur with improper specimen collection / handling, submission of specimen other than nasopharyngeal swab, presence of viral mutation(s) within the areas targeted by this assay, and inadequate number of viral copies (<250 copies / mL). A negative result must be combined with clinical observations, patient history, and epidemiological information.  Fact Sheet for Patients:   RoadLapTop.co.za  Fact Sheet for Healthcare Providers: http://kim-miller.com/  This test is not yet approved or  cleared by the [place] FDA and has been authorized for detection and/or diagnosis of SARS-CoV-2 by FDA under an Emergency Use Authorization (EUA).  This EUA will remain in effect (meaning this test can be used) for the duration of the COVID-19 declaration under Section 564(b)(1) of the Act, 21 U.S.C. section 360bbb-3(b)(1), unless the authorization is terminated or revoked sooner.  Performed at [facility], [address] [place], [place] [address]   MRSA Next Gen by PCR, Nasal     Status: None   Collection Time: 02/02/23  4:57  PM   Specimen: Nasal Mucosa; Nasal Swab  Result Value Ref Range Status   MRSA by PCR Next Gen NOT DETECTED NOT DETECTED Final    Comment: (NOTE) The GeneXpert MRSA Assay (FDA approved for NASAL specimens only), is one component of a comprehensive MRSA colonization surveillance program. It is not intended to diagnose MRSA infection nor to guide or monitor treatment for MRSA infections. Test performance is not FDA approved in patients less than 33 years old. Performed at [facility], [address] [place], [place] [address]     Labs: CBC: Recent Labs  Lab 02/02/23 1148 02/03/23 0431 02/04/23 0510  WBC 5.8 8.4 7.8  HGB 10.5* 10.7* 11.4*  HCT 31.6* 32.4* 33.8*  MCV 92.9 92.6 90.4  PLT 241 252 283   Basic Metabolic Panel: Recent Labs  Lab 02/02/23 1149 02/03/23 0431 02/03/23 0433 02/04/23 0510  NA 137 140 140 140  K 3.3* 3.9 3.8 3.6  CL 107 111 111 107  CO2 24 28 28 26   GLUCOSE 141* 95 97 89  BUN 25* 18 18 14   CREATININE 1.18* 0.96 0.98 0.96  CALCIUM 8.2* 8.2* 8.1* 9.0  PHOS  --   --  2.8 3.9   Liver Function Tests: Recent Labs  Lab 02/02/23 1149 02/03/23 0433 02/04/23 0510  AST 18  --   --   ALT 13  --   --   ALKPHOS 39  --   --   BILITOT 0.5  --   --   PROT 5.8*  --   --   ALBUMIN 3.5 3.3* 3.5   CBG: No results for input(s): "GLUCAP" in the last 168 hours.  Discharge time spent: greater than 30 minutes.  Signed: Lurene Shadow, MD [facility] 02/04/2023

## 2023-02-05 LAB — CULTURE, BLOOD (ROUTINE X 2): Culture: NO GROWTH

## 2023-02-06 LAB — CULTURE, BLOOD (ROUTINE X 2)

## 2023-02-07 LAB — CULTURE, BLOOD (ROUTINE X 2): Culture: NO GROWTH

## 2023-03-27 ENCOUNTER — Other Ambulatory Visit: Payer: Medicare Other

## 2023-03-27 DIAGNOSIS — E782 Mixed hyperlipidemia: Secondary | ICD-10-CM

## 2023-03-27 DIAGNOSIS — I10 Essential (primary) hypertension: Secondary | ICD-10-CM

## 2023-03-27 DIAGNOSIS — R7301 Impaired fasting glucose: Secondary | ICD-10-CM | POA: Diagnosis not present

## 2023-03-28 ENCOUNTER — Ambulatory Visit (INDEPENDENT_AMBULATORY_CARE_PROVIDER_SITE_OTHER): Payer: Medicare Other | Admitting: Internal Medicine

## 2023-03-28 VITALS — BP 130/79 | HR 79 | Ht 62.0 in | Wt 166.6 lb

## 2023-03-28 DIAGNOSIS — K5904 Chronic idiopathic constipation: Secondary | ICD-10-CM | POA: Diagnosis not present

## 2023-03-28 DIAGNOSIS — R7303 Prediabetes: Secondary | ICD-10-CM

## 2023-03-28 DIAGNOSIS — I10 Essential (primary) hypertension: Secondary | ICD-10-CM | POA: Diagnosis not present

## 2023-03-28 DIAGNOSIS — E782 Mixed hyperlipidemia: Secondary | ICD-10-CM | POA: Diagnosis not present

## 2023-03-28 DIAGNOSIS — N179 Acute kidney failure, unspecified: Secondary | ICD-10-CM | POA: Diagnosis not present

## 2023-03-28 MED ORDER — ROSUVASTATIN CALCIUM 5 MG PO TABS
5.0000 mg | ORAL_TABLET | Freq: Every evening | ORAL | 0 refills | Status: DC
Start: 2023-03-28 — End: 2023-07-18

## 2023-03-28 MED ORDER — LINACLOTIDE 72 MCG PO CAPS
72.0000 ug | ORAL_CAPSULE | Freq: Every day | ORAL | 0 refills | Status: DC
Start: 2023-03-28 — End: 2023-07-18

## 2023-03-28 NOTE — Progress Notes (Signed)
Established Patient Office Visit  Subjective:  Patient ID: Deborah Hurst, female    DOB: [birth year]  Age: 82 y.o. MRN: [id number]  Chief Complaint  Patient presents with   Follow-up    3 mo lab results    No new complaints, here for lab review and medication refills. Syncopal episode 2 months ago with 3 day hospitalization for hypotension. Currently only taking  Chlorthalidone every other day on average with well controlled bp. LDL and TC well controlled on lab review. Triglycerides have slightly deteriorated since last eval. CMP notable for azotemia and acute deterioration in Cr.    No other concerns at this time.   Past Medical History:  Diagnosis Date   GERD (gastroesophageal reflux disease)    Hyperlipidemia    Hypertension     Past Surgical History:  Procedure Laterality Date   ABDOMINAL HYSTERECTOMY     CHOLECYSTECTOMY     KNEE SURGERY Right     Social History   Socioeconomic History   Marital status: Married    Spouse name: Not on file   Number of children: Not on file   Years of education: Not on file   Highest education level: Not on file  Occupational History   Not on file  Tobacco Use   Smoking status: Never   Smokeless tobacco: Never  Vaping Use   Vaping status: Never Used  Substance and Sexual Activity   Alcohol use: Never   Drug use: Never   Sexual activity: Not on file  Other Topics Concern   Not on file  Social History Narrative   Not on file   Social Determinants of Health   Financial Resource Strain: Not on file  Food Insecurity: No Food Insecurity (02/02/2023)   Hunger Vital Sign    Worried About Running Out of Food in the Last Year: Never true    Ran Out of Food in the Last Year: Never true  Transportation Needs: No Transportation Needs (02/02/2023)   PRAPARE - Administrator, Civil Service (Medical): No    Lack of Transportation (Non-Medical): No  Physical Activity: Not on file  Stress: Not on file  Social  Connections: Not on file  Intimate Partner Violence: Not At Risk (02/02/2023)   Humiliation, Afraid, Rape, and Kick questionnaire    Fear of Current or Ex-Partner: No    Emotionally Abused: No    Physically Abused: No    Sexually Abused: No    Family History  Problem Relation Age of Onset   Heart attack Mother 58   Prostate cancer Father    Breast cancer Neg Hx     No Known Allergies  Review of Systems  Constitutional: Negative.   HENT: Negative.    Eyes: Negative.   Respiratory: Negative.    Cardiovascular: Negative.   Gastrointestinal:  Positive for vomiting.  Genitourinary: Negative.   Skin: Negative.   Neurological:  Positive for dizziness.  Endo/Heme/Allergies: Negative.        Objective:   BP 130/79   Pulse 79   Ht 5\' 2"  (1.575 m)   Wt 166 lb 9.6 oz (75.6 kg)   SpO2 99%   BMI 30.47 kg/m   Vitals:   03/28/23 1054  BP: 130/79  Pulse: 79  Height: 5\' 2"  (1.575 m)  Weight: 166 lb 9.6 oz (75.6 kg)  SpO2: 99%  BMI (Calculated): 30.46    Physical Exam Vitals reviewed.  Constitutional:      General: She is not **Note De-Identified via Obfucation** in acute ditre. HENT:     Head: Normocephalic.     Noe: Noe normal.     Mouth/Throat:     Mouth: Mucou membrane are moit.  Eye:     Extraocular Movement: Extraocular movement intact.     Pupil: Pupil are equal, round, and reactive to light.  Cardiovacular:     Rate and Rhythm: Normal rate and regular rhythm.     Heart ound: No murmur heard. Pulmonary:     Effort: Pulmonary effort i normal.     Breath ound: No rhonchi or rale.  Abdominal:     General: Abdomen i flat.     Palpation: There i no hepatomegaly, plenomegaly or ma.  Muculokeletal:        General: Normal range of motion.     Cervical back: Normal range of motion. No tenderne.  Skin:    General: Skin i warm and dry.  Neurological:     General: No focal deficit preent.     Mental Statu: She i alert and oriented to peron, place, and time.      Cranial Nerve: No cranial nerve deficit.     Motor: No weakne.  Pychiatric:        Mood and Affect: Mood normal.        Behavior: Behavior normal.      No reult found for any viit on 03/28/23.  Recent Reult (from the pat 2160 hour())  CBC     Statu: Abnormal   Collection Time: 02/02/23 11:48 AM  Reult Value Ref Range   WBC 5.8 4.0 - 10.5 K/uL   RBC 3.40 (L) 3.87 - 5.11 MIL/uL   Hemoglobin 10.5 (L) 12.0 - 15.0 g/dL   HCT 16.1 (L) 09.6 - 04.5 %   MCV 92.9 80.0 - 100.0 fL   MCH 30.9 26.0 - 34.0 pg   MCHC 33.2 30.0 - 36.0 g/dL   RDW 40.9 81.1 - 91.4 %   Platelet 241 150 - 400 K/uL   nRBC 0.0 0.0 - 0.2 %    Comment: Performed at [facility], [address] [place] [address], [place], [place] [address]  Troponin I (High Senitivity)     Statu: None   Collection Time: 02/02/23 11:48 AM  Reult Value Ref Range   Troponin I (High Senitivity) 4 <18 ng/L    Comment: (NOTE) Elevated high enitivity troponin I (hTnI) value and ignificant  change acro erial meaurement may ugget ACS but many other  chronic and acute condition are known to elevate hTnI reult.  Refer to the "Link" ection for chet pain algorithm and additional  guidance. Performed at [facility], [address] [place], [place] [address]   Lactic acid, plama     Statu: None   Collection Time: 02/02/23 11:48 AM  Reult Value Ref Range   Lactic Acid, Venou 1.7 0.5 - 1.9 mmol/L    Comment: Performed at [facility], [address] [place], [place] [address]  Protime-INR     Statu: None   Collection Time: 02/02/23 11:48 AM  Reult Value Ref Range   Prothrombin Time 14.2 11.4 - 15.2 econd   INR 1.1 0.8 - 1.2    Comment: (NOTE) INR goal varie baed on device and dieae tate. Performed at [facility], [address] [place], [place] [address]   APTT     Statu: Abnormal   Collection Time: 02/02/23 11:48 AM  Reult Value Ref Range   aPTT 21 (L)  24 -  36 seconds    Comment: Performed at [facility], [address] [place], [place] [address]  Lipase, blood     Status: None   Collection Time: 02/02/23 11:48 AM  Result Value Ref Range   Lipase 31 11 - 51 U/L    Comment: Performed at [facility], [address] [place], [place] [address]  Brain natriuretic peptide     Status: None   Collection Time: 02/02/23 11:48 AM  Result Value Ref Range   B Natriuretic Peptide 82.2 0.0 - 100.0 pg/mL    Comment: Performed at [facility], [address] [place], [place] [address]  Comprehensive metabolic panel     Status: Abnormal   Collection Time: 02/02/23 11:49 AM  Result Value Ref Range   Sodium 137 135 - 145 mmol/L   Potassium 3.3 (L) 3.5 - 5.1 mmol/L   Chloride 107 98 - 111 mmol/L   CO2 24 22 - 32 mmol/L   Glucose, Bld 141 (H) 70 - 99 mg/dL    Comment: Glucose reference range applies only to samples taken after fasting for at least 8 hours.   BUN 25 (H) 8 - 23 mg/dL   Creatinine, Ser 8.41 (H) 0.44 - 1.00 mg/dL   Calcium 8.2 (L) 8.9 - 10.3 mg/dL   Total Protein 5.8 (L) 6.5 - 8.1 g/dL   Albumin 3.5 3.5 - 5.0 g/dL   AST 18 15 - 41 U/L   ALT 13 0 - 44 U/L   Alkaline Phosphatase 39 38 - 126 U/L   Total Bilirubin 0.5 0.3 - 1.2 mg/dL   GFR, Estimated 47 (L) >60 mL/min    Comment: (NOTE) Calculated using the CKD-EPI Creatinine Equation (2021)    Anion gap 6 5 - 15    Comment: Performed at [facility], [address] [place], [place] [address]  Blood Culture (routine x 2)     Status: None   Collection Time: 02/02/23 11:59 AM   Specimen: BLOOD RIGHT ARM  Result Value Ref Range   Specimen Description BLOOD RIGHT ARM    Special Requests      BOTTLES DRAWN AEROBIC AND ANAEROBIC Blood Culture adequate volume   Culture      NO GROWTH 5 DAYS Performed at [facility], [address] [place], [place] [address]    Report Status 02/07/2023 FINAL   Blood Culture (routine x 2)      Status: None   Collection Time: 02/02/23 12:00 PM   Specimen: BLOOD LEFT ARM  Result Value Ref Range   Specimen Description BLOOD LEFT ARM    Special Requests      BOTTLES DRAWN AEROBIC AND ANAEROBIC Blood Culture results may not be optimal due to an excessive volume of blood received in culture bottles   Culture      NO GROWTH 5 DAYS Performed at [facility], [address] [place], [place] [address]    Report Status 02/07/2023 FINAL   Lactic acid, plasma     Status: None   Collection Time: 02/02/23  1:48 PM  Result Value Ref Range   Lactic Acid, Venous 0.9 0.5 - 1.9 mmol/L    Comment: Performed at [facility], [address] [place], [place] [address]  Urinalysis, w/ Reflex to Culture (Infection Suspected) -Urine, Clean Catch     Status: Abnormal   Collection Time: 02/02/23  1:48 PM  Result Value Ref Range   Specimen Source URINE, CLEAN CATCH  Color, Urine YELLOW (A) YELLOW   APPearance CLEAR (A) CLEAR   Specific Gravity, Urine 1.013 1.005 - 1.030   pH 6.0 5.0 - 8.0   Glucose, UA NEGATIVE NEGATIVE mg/dL   Hgb urine dipstick NEGATIVE NEGATIVE   Bilirubin Urine NEGATIVE NEGATIVE   Ketones, ur NEGATIVE NEGATIVE mg/dL   Protein, ur NEGATIVE NEGATIVE mg/dL   Nitrite NEGATIVE NEGATIVE   Leukocytes,Ua MODERATE (A) NEGATIVE   RBC / HPF 0-5 0 - 5 RBC/hpf   WBC, UA 0-5 0 - 5 WBC/hpf    Comment:        Reflex urine culture not performed if WBC <=10, OR if Squamous epithelial cells >5. If Squamous epithelial cells >5 suggest recollection.    Bacteria, UA NONE SEEN NONE SEEN   Squamous Epithelial / HPF 0-5 0 - 5 /HPF   Mucus PRESENT     Comment: Performed at [facility], [address] [place], [place] [address]  Troponin I (High Sensitivity)     Status: None   Collection Time: 02/02/23  1:48 PM  Result Value Ref Range   Troponin I (High Sensitivity) 6 <18 ng/L    Comment: (NOTE) Elevated high sensitivity troponin I (hsTnI) values and  significant  changes across serial measurements may suggest ACS but many other  chronic and acute conditions are known to elevate hsTnI results.  Refer to the "Links" section for chest pain algorithms and additional  guidance. Performed at [facility], [address] [place], [place] [address]   Procalcitonin     Status: None   Collection Time: 02/02/23  1:48 PM  Result Value Ref Range   Procalcitonin <0.10 ng/mL    Comment:        Interpretation: PCT (Procalcitonin) <= 0.5 ng/mL: Systemic infection (sepsis) is not likely. Local bacterial infection is possible. (NOTE)       Sepsis PCT Algorithm           Lower Respiratory Tract                                      Infection PCT Algorithm    ----------------------------     ----------------------------         PCT < 0.25 ng/mL                PCT < 0.10 ng/mL          Strongly encourage             Strongly discourage   discontinuation of antibiotics    initiation of antibiotics    ----------------------------     -----------------------------       PCT 0.25 - 0.50 ng/mL            PCT 0.10 - 0.25 ng/mL               OR       >80% decrease in PCT            Discourage initiation of                                            antibiotics      Encourage discontinuation           of antibiotics    ----------------------------     -----------------------------  PCT >= 0.50 ng/mL              PCT 0.26 - 0.50 ng/mL               AND        <80% decrease in PCT             Encourage initiation of                                             antibiotics       Encourage continuation           of antibiotics    ----------------------------     -----------------------------        PCT >= 0.50 ng/mL                  PCT > 0.50 ng/mL               AND         increase in PCT                  Strongly encourage                                      initiation of antibiotics    Strongly encourage escalation           of  antibiotics                                     -----------------------------                                           PCT <= 0.25 ng/mL                                                 OR                                        > 80% decrease in PCT                                      Discontinue / Do not initiate                                             antibiotics  Performed at [facility], [address] [place], [place] [address]   SARS Coronavirus 2 by RT PCR (hospital order, performed in [facility] hospital lab) *cepheid single result test* Anterior Nasal Swab     Status: None   Collection Time: 02/02/23  3:06 PM   Specimen: Anterior Nasal Swab  Result Value Ref Range   SARS **Note De-Identified via Obfucation** Coronaviru 2 by RT PCR NEGATIVE NEGATIVE    Comment: (NOTE) AR-CoV-2 target nucleic acid are NOT DETECTED.  The AR-CoV-2 RNA i generally detectable in upper and lower repiratory pecimen during the acute phae of infection. The lowet concentration of AR-CoV-2 viral copie thi aay can detect i 250 copie / mL. A negative reult doe not preclude AR-CoV-2 infection and hould not be ued a the ole bai for treatment or other patient management deciion.  A negative reult may occur with improper pecimen collection / handling, ubmiion of pecimen other than naopharyngeal wab, preence of viral mutation() within the area targeted by thi aay, and inadequate number of viral copie (<250 copie / mL). A negative reult mut be combined with clinical obervation, patient hitory, and epidemiological information.  Fact heet for Patient:   RoadLapTop.co.za  Fact heet for Healthcare Provider: http://kim-miller.com/  Thi tet i not yet approved or  cleared by the [place] FDA and ha been authorized for detection and/or diagnoi of AR-CoV-2 by FDA under an Emergency Ue Authorization (EUA).  Thi EUA will  remain in effect (meaning thi tet can be ued) for the duration of the COVID-19 declaration under ection 564(b)(1) of the Act, 21 U..C. ection 360bbb-3(b)(1), unle the authorization i terminated or revoked ooner.  Performed at [facility], [address] [place], [place] [address]   MRA Next Gen by PCR, Naal     tatu: None   Collection Time: 02/02/23  4:57 PM   pecimen: Naal Mucoa; Naal wab  Reult Value Ref Range   MRA by PCR Next Gen NOT DETECTED NOT DETECTED    Comment: (NOTE) The GeneXpert MRA Aay (FDA approved for NAAL pecimen only), i one component of a comprehenive MRA colonization urveillance program. It i not intended to diagnoe MRA infection nor to guide or monitor treatment for MRA infection. Tet performance i not FDA approved in patient le than 81 year old. Performed at [facility], [address] [place], [place] [address]   Cortiol     tatu: None   Collection Time: 02/02/23  5:57 PM  Reult Value Ref Range   Cortiol, Plama 3.9 ug/dL    Comment: (NOTE) AM    6.7 - 22.6 ug/dL PM   <47.4       ug/dL Performed at [facility], [address], [place], [place] [address]   Lactic acid, plama     tatu: None   Collection Time: 02/02/23  5:57 PM  Reult Value Ref Range   Lactic Acid, Venou 0.7 0.5 - 1.9 mmol/L    Comment: Performed at [facility], [address] [place], [place] [address]  Lactic acid, plama     tatu: None   Collection Time: 02/02/23  9:12 PM  Reult Value Ref Range   Lactic Acid, Venou 1.0 0.5 - 1.9 mmol/L    Comment: Performed at [facility], [address] [place], [place] [address]  Lactic acid, plama     tatu: None   Collection Time: 02/03/23 12:18 AM  Reult Value Ref Range   Lactic Acid, Venou 1.4 0.5 - 1.9 mmol/L    Comment: Performed at [facility], [address] [place], [place] [address]  Procalcitonin     tatu:  None   Collection Time: 02/03/23  4:31 AM  Reult Value Ref Range   Procalcitonin <0.10 ng/mL    Comment:        Interpretation: PCT (Procalcitonin) <= 0.5 ng/mL:  Systemic infection (sepsis) is not likely. Local bacterial infection is possible. (NOTE)       Sepsis PCT Algorithm           Lower Respiratory Tract                                      Infection PCT Algorithm    ----------------------------     ----------------------------         PCT < 0.25 ng/mL                PCT < 0.10 ng/mL          Strongly encourage             Strongly discourage   discontinuation of antibiotics    initiation of antibiotics    ----------------------------     -----------------------------       PCT 0.25 - 0.50 ng/mL            PCT 0.10 - 0.25 ng/mL               OR       >80% decrease in PCT            Discourage initiation of                                            antibiotics      Encourage discontinuation           of antibiotics    ----------------------------     -----------------------------         PCT >= 0.50 ng/mL              PCT 0.26 - 0.50 ng/mL               AND        <80% decrease in PCT             Encourage initiation of                                             antibiotics       Encourage continuation           of antibiotics    ----------------------------     -----------------------------        PCT >= 0.50 ng/mL                  PCT > 0.50 ng/mL               AND         increase in PCT                  Strongly encourage                                      initiation of antibiotics    Strongly encourage escalation           of antibiotics                                     -----------------------------  PCT <= 0.25 ng/mL                                                 OR                                        > 80% decrease in PCT                                      Discontinue / Do not initiate                                              antibiotics  Performed at [facility], [address] [place], [place] [address]   CBC     Status: Abnormal   Collection Time: 02/03/23  4:31 AM  Result Value Ref Range   WBC 8.4 4.0 - 10.5 K/uL   RBC 3.50 (L) 3.87 - 5.11 MIL/uL   Hemoglobin 10.7 (L) 12.0 - 15.0 g/dL   HCT 95.6 (L) 21.3 - 08.6 %   MCV 92.6 80.0 - 100.0 fL   MCH 30.6 26.0 - 34.0 pg   MCHC 33.0 30.0 - 36.0 g/dL   RDW 57.8 46.9 - 62.9 %   Platelets 252 150 - 400 K/uL   nRBC 0.0 0.0 - 0.2 %    Comment: Performed at [facility], [address] [place], [place] [address]  Basic metabolic panel     Status: Abnormal   Collection Time: 02/03/23  4:31 AM  Result Value Ref Range   Sodium 140 135 - 145 mmol/L    Comment: ELECTROLYTES REPEATED TO VERIFY ASW   Potassium 3.9 3.5 - 5.1 mmol/L   Chloride 111 98 - 111 mmol/L   CO2 28 22 - 32 mmol/L   Glucose, Bld 95 70 - 99 mg/dL    Comment: Glucose reference range applies only to samples taken after fasting for at least 8 hours.   BUN 18 8 - 23 mg/dL   Creatinine, Ser 3.24 0.44 - 1.00 mg/dL   Calcium 8.2 (L) 8.9 - 10.3 mg/dL   GFR, Estimated 60 (L) >60 mL/min    Comment: (NOTE) Calculated using the CKD-EPI Creatinine Equation (2021)    Anion gap 1 (L) 5 - 15    Comment: Performed at [facility], [address] [place], [place] [address]  Renal function panel     Status: Abnormal   Collection Time: 02/03/23  4:33 AM  Result Value Ref Range   Sodium 140 135 - 145 mmol/L    Comment: ELECTROLYTES REPEATED TO VERIFY ASW   Potassium 3.8 3.5 - 5.1 mmol/L   Chloride 111 98 - 111 mmol/L   CO2 28 22 - 32 mmol/L   Glucose, Bld 97 70 - 99 mg/dL    Comment: Glucose reference range applies only to samples taken after fasting for at least 8 hours.   BUN 18 8 - 23 mg/dL   Creatinine, Ser 7.25 0.44 - 1.00 mg/dL   Calcium 8.1 (L) 8.9 - 10.3  mg/dL   Phosphorus 2.8 2.5 - 4.6 mg/dL   Albumin 3.3 (L) 3.5 - 5.0 g/dL   GFR,  Estimated 58 (L) >60 mL/min    Comment: (NOTE) Calculated using the CKD-EPI Creatinine Equation (2021)    Anion gap 1 (L) 5 - 15    Comment: Performed at [facility], [address] [place], [place] [address]  Procalcitonin     Status: None   Collection Time: 02/04/23  5:10 AM  Result Value Ref Range   Procalcitonin <0.10 ng/mL    Comment:        Interpretation: PCT (Procalcitonin) <= 0.5 ng/mL: Systemic infection (sepsis) is not likely. Local bacterial infection is possible. (NOTE)       Sepsis PCT Algorithm           Lower Respiratory Tract                                      Infection PCT Algorithm    ----------------------------     ----------------------------         PCT < 0.25 ng/mL                PCT < 0.10 ng/mL          Strongly encourage             Strongly discourage   discontinuation of antibiotics    initiation of antibiotics    ----------------------------     -----------------------------       PCT 0.25 - 0.50 ng/mL            PCT 0.10 - 0.25 ng/mL               OR       >80% decrease in PCT            Discourage initiation of                                            antibiotics      Encourage discontinuation           of antibiotics    ----------------------------     -----------------------------         PCT >= 0.50 ng/mL              PCT 0.26 - 0.50 ng/mL               AND        <80% decrease in PCT             Encourage initiation of                                             antibiotics       Encourage continuation           of antibiotics    ----------------------------     -----------------------------        PCT >= 0.50 ng/mL                  PCT > 0.50 ng/mL               AND  increase in PCT                  Strongly encourage                                      initiation of antibiotics    Strongly encourage escalation           of antibiotics                                     -----------------------------                                            PCT <= 0.25 ng/mL                                                 OR                                        > 80% decrease in PCT                                      Discontinue / Do not initiate                                             antibiotics  Performed at [facility], [address] [place], [place] [address]   CBC     Status: Abnormal   Collection Time: 02/04/23  5:10 AM  Result Value Ref Range   WBC 7.8 4.0 - 10.5 K/uL   RBC 3.74 (L) 3.87 - 5.11 MIL/uL   Hemoglobin 11.4 (L) 12.0 - 15.0 g/dL   HCT 21.3 (L) 08.6 - 57.8 %   MCV 90.4 80.0 - 100.0 fL   MCH 30.5 26.0 - 34.0 pg   MCHC 33.7 30.0 - 36.0 g/dL   RDW 46.9 62.9 - 52.8 %   Platelets 283 150 - 400 K/uL   nRBC 0.0 0.0 - 0.2 %    Comment: Performed at [facility], [address], [place], [place] [address]  Renal function panel     Status: Abnormal   Collection Time: 02/04/23  5:10 AM  Result Value Ref Range   Sodium 140 135 - 145 mmol/L   Potassium 3.6 3.5 - 5.1 mmol/L   Chloride 107 98 - 111 mmol/L   CO2 26 22 - 32 mmol/L   Glucose, Bld 89 70 - 99 mg/dL    Comment: Glucose reference range applies only to samples taken after fasting for at least 8 hours.   BUN 14 8 - 23 mg/dL   Creatinine, Ser 4.01 0.44 - 1.00 mg/dL   Calcium 9.0 8.9 - 02.7 mg/dL   Phosphorus 3.9 2.5 - 4.6 mg/dL   Albumin 3.5 3.5 -  5.0 g/dL   GFR, Estimated 60 (L) >60 mL/min    Comment: (NOTE) Calculated using the CKD-EPI Creatinine Equation (2021)    Anion gap 7 5 - 15    Comment: Performed at [facility], [address] [place], [place] [address]  Lipid panel     Status: Abnormal   Collection Time: 03/27/23  8:49 AM  Result Value Ref Range   Cholesterol, Total 127 100 - 199 mg/dL   Triglycerides 604 (H) 0 - 149 mg/dL   HDL 41 >54 mg/dL   VLDL Cholesterol Cal 26 5 - 40 mg/dL   LDL Chol Calc (NIH) 60 0 - 99 mg/dL   Chol/HDL Ratio 3.1 0.0 - 4.4 ratio    Comment:                                    T. Chol/HDL Ratio                                             Men  Women                               1/2 Avg.Risk  3.4    3.3                                   Avg.Risk  5.0    4.4                                2X Avg.Risk  9.6    7.1                                3X Avg.Risk 23.4   11.0   Hemoglobin A1c     Status: Abnormal   Collection Time: 03/27/23  8:49 AM  Result Value Ref Range   Hgb A1c MFr Bld 5.9 (H) 4.8 - 5.6 %    Comment:          Prediabetes: 5.7 - 6.4          Diabetes: >6.4          Glycemic control for adults with diabetes: <7.0    Est. average glucose Bld gHb Est-mCnc 123 mg/dL  Comprehensive metabolic panel     Status: Abnormal   Collection Time: 03/27/23  8:49 AM  Result Value Ref Range   Glucose 98 70 - 99 mg/dL   BUN 29 (H) 8 - 27 mg/dL   Creatinine, Ser 0.98 (H) 0.57 - 1.00 mg/dL   eGFR 38 (L) >11 BJ/YNW/2.95   BUN/Creatinine Ratio 21 12 - 28   Sodium 143 134 - 144 mmol/L   Potassium 4.0 3.5 - 5.2 mmol/L   Chloride 102 96 - 106 mmol/L   CO2 29 20 - 29 mmol/L   Calcium 9.8 8.7 - 10.3 mg/dL   Total Protein 6.8 6.0 - 8.5 g/dL   Albumin 4.6 3.7 - 4.7 g/dL   Globulin, Total 2.2 1.5 - 4.5 g/dL   Bilirubin Total 0.3 0.0 - 1.2 mg/dL   Alkaline  Phosphatase 60 44 - 121 IU/L   AST 18 0 - 40 IU/L   ALT 17 0 - 32 IU/L      Assessment & Plan:  As per problem list. Increase fluid intake.  Problem List Items Addressed This Visit       Cardiovascular and Mediastinum   Primary hypertension - Primary   Relevant Medications   rosuvastatin (CRESTOR) 5 MG tablet     Digestive   Chronic idiopathic constipation   Relevant Medications   linaclotide (LINZESS) 72 MCG capsule     Other   Mixed hyperlipidemia   Relevant Medications   rosuvastatin (CRESTOR) 5 MG tablet   Prediabetes   Other Visit Diagnoses     Acute kidney injury (HCC)       Relevant Orders   BMP8+Anion Gap       Return in about 3 weeks (around 04/18/2023) for  fu with labs prior.   Total time spent: 20 minutes  Luna Fuse, MD  03/28/2023   This document may have been prepared by [facility] Voice Recognition software and as such may include unintentional dictation errors.

## 2023-04-14 ENCOUNTER — Other Ambulatory Visit: Payer: Medicare Other

## 2023-04-14 DIAGNOSIS — N179 Acute kidney failure, unspecified: Secondary | ICD-10-CM | POA: Diagnosis not present

## 2023-04-18 ENCOUNTER — Ambulatory Visit (INDEPENDENT_AMBULATORY_CARE_PROVIDER_SITE_OTHER): Payer: Medicare Other | Admitting: Internal Medicine

## 2023-04-18 VITALS — BP 125/70 | HR 84 | Ht 62.0 in | Wt 165.4 lb

## 2023-04-18 DIAGNOSIS — E782 Mixed hyperlipidemia: Secondary | ICD-10-CM | POA: Diagnosis not present

## 2023-04-18 DIAGNOSIS — I10 Essential (primary) hypertension: Secondary | ICD-10-CM

## 2023-04-18 DIAGNOSIS — N179 Acute kidney failure, unspecified: Secondary | ICD-10-CM | POA: Diagnosis not present

## 2023-04-18 DIAGNOSIS — R7303 Prediabetes: Secondary | ICD-10-CM | POA: Diagnosis not present

## 2023-04-18 DIAGNOSIS — J301 Allergic rhinitis due to pollen: Secondary | ICD-10-CM | POA: Diagnosis not present

## 2023-04-18 MED ORDER — CETIRIZINE HCL 10 MG PO TABS
10.0000 mg | ORAL_TABLET | Freq: Every day | ORAL | 2 refills | Status: DC
Start: 2023-04-18 — End: 2023-07-18

## 2023-04-18 MED ORDER — AZELASTINE HCL 137 MCG/SPRAY NA SOLN
1.0000 | Freq: Every day | NASAL | 2 refills | Status: DC
Start: 2023-04-18 — End: 2024-02-05

## 2023-04-18 NOTE — Progress Notes (Signed)
Established Patient Office Visit  Subjective:  Patient ID: Deborah Hurst, female    DOB: 1941/06/24  Age: 82 y.o. MRN: [id number]  Chief Complaint  Patient presents with   Follow-up    3 week f/u lab results    No new complaints, here for lab review and medication refills. Labs reviewed and notable for improvement in Cr and Gfr and normalization of BUN with increased fluid intake. C/o cough and postnasal drip .   No other concerns at this time.   Past Medical History:  Diagnosis Date   GERD (gastroesophageal reflux disease)    Hyperlipidemia    Hypertension     Past Surgical History:  Procedure Laterality Date   ABDOMINAL HYSTERECTOMY     CHOLECYSTECTOMY     KNEE SURGERY Right     Social History   Socioeconomic History   Marital status: Married    Spouse name: Not on file   Number of children: Not on file   Years of education: Not on file   Highest education level: Not on file  Occupational History   Not on file  Tobacco Use   Smoking status: Never   Smokeless tobacco: Never  Vaping Use   Vaping status: Never Used  Substance and Sexual Activity   Alcohol use: Never   Drug use: Never   Sexual activity: Not on file  Other Topics Concern   Not on file  Social History Narrative   Not on file   Social Determinants of Health   Financial Resource Strain: Not on file  Food Insecurity: No Food Insecurity (02/02/2023)   Hunger Vital Sign    Worried About Running Out of Food in the Last Year: Never true    Ran Out of Food in the Last Year: Never true  Transportation Needs: No Transportation Needs (02/02/2023)   PRAPARE - Administrator, Civil Service (Medical): No    Lack of Transportation (Non-Medical): No  Physical Activity: Not on file  Stress: Not on file  Social Connections: Not on file  Intimate Partner Violence: Not At Risk (02/02/2023)   Humiliation, Afraid, Rape, and Kick questionnaire    Fear of Current or Ex-Partner: No     Emotionally Abused: No    Physically Abused: No    Sexually Abused: No    Family History  Problem Relation Age of Onset   Heart attack Mother 36   Prostate cancer Father    Breast cancer Neg Hx     No Known Allergies  Review of Systems  Constitutional: Negative.   HENT: Negative.    Eyes: Negative.   Respiratory: Negative.    Cardiovascular: Negative.   Genitourinary: Negative.   Skin: Negative.   Neurological:  Negative for dizziness.  Endo/Heme/Allergies: Negative.        Objective:   BP 125/70   Pulse 84   Ht 5\' 2"  (1.575 m)   Wt 165 lb 6.4 oz (75 kg)   SpO2 96%   BMI 30.25 kg/m   Vitals:   04/18/23 1422  BP: 125/70  Pulse: 84  Height: 5\' 2"  (1.575 m)  Weight: 165 lb 6.4 oz (75 kg)  SpO2: 96%  BMI (Calculated): 30.24    Physical Exam Vitals reviewed.  Constitutional:      General: She is not in acute distress. HENT:     Head: Normocephalic.     Nose: Nose normal.     Mouth/Throat:     Mouth: Mucous membranes are moist.  Eyes:     Extraocular Movements: Extraocular movements intact.     Pupils: Pupils are equal, round, and reactive to light.  Cardiovascular:     Rate and Rhythm: Normal rate and regular rhythm.     Heart sounds: No murmur heard. Pulmonary:     Effort: Pulmonary effort is normal.     Breath sounds: No rhonchi or rales.  Abdominal:     General: Abdomen is flat.     Palpations: There is no hepatomegaly, splenomegaly or mass.  Musculoskeletal:        General: Normal range of motion.     Cervical back: Normal range of motion. No tenderness.  Skin:    General: Skin is warm and dry.  Neurological:     General: No focal deficit present.     Mental Status: She is alert and oriented to person, place, and time.     Cranial Nerves: No cranial nerve deficit.     Motor: No weakness.  Psychiatric:        Mood and Affect: Mood normal.        Behavior: Behavior normal.      No results found for any visits on 04/18/23.  Recent  Results (from the past 2160 hour(Reisa Coppola))  CBC     Status: Abnormal   Collection Time: 02/02/23 11:48 AM  Result Value Ref Range   WBC 5.8 4.0 - 10.5 K/uL   RBC 3.40 (L) 3.87 - 5.11 MIL/uL   Hemoglobin 10.5 (L) 12.0 - 15.0 g/dL   HCT 29.5 (L) 28.4 - 13.2 %   MCV 92.9 80.0 - 100.0 fL   MCH 30.9 26.0 - 34.0 pg   MCHC 33.2 30.0 - 36.0 g/dL   RDW 44.0 10.2 - 72.5 %   Platelets 241 150 - 400 K/uL   nRBC 0.0 0.0 - 0.2 %    Comment: Performed at [facility], [address], [place], [place] [address]  Troponin I (High Sensitivity)     Status: None   Collection Time: 02/02/23 11:48 AM  Result Value Ref Range   Troponin I (High Sensitivity) 4 <18 ng/L    Comment: (NOTE) Elevated high sensitivity troponin I (hsTnI) values and significant  changes across serial measurements may suggest ACS but many other  chronic and acute conditions are known to elevate hsTnI results.  Refer to the "Links" section for chest pain algorithms and additional  guidance. Performed at [facility], [address] [place], [place] [address]   Lactic acid, plasma     Status: None   Collection Time: 02/02/23 11:48 AM  Result Value Ref Range   Lactic Acid, Venous 1.7 0.5 - 1.9 mmol/L    Comment: Performed at [facility], [address] [place], [place] [address]  Protime-INR     Status: None   Collection Time: 02/02/23 11:48 AM  Result Value Ref Range   Prothrombin Time 14.2 11.4 - 15.2 seconds   INR 1.1 0.8 - 1.2    Comment: (NOTE) INR goal varies based on device and disease states. Performed at [facility], [address] [place], [place] [address]   APTT     Status: Abnormal   Collection Time: 02/02/23 11:48 AM  Result Value Ref Range   aPTT 21 (L) 24 - 36 seconds    Comment: Performed at [facility], [address] [place], [place] [address]  Lipase, blood     Status: None   Collection Time: 02/02/23 11:48  AM  Result Value Ref Range   Lipase 31 11  - 51 U/L    Comment: Performed at [facility], [address] [place], [place] [address]  Brain natriuretic peptide     Status: None   Collection Time: 02/02/23 11:48 AM  Result Value Ref Range   B Natriuretic Peptide 82.2 0.0 - 100.0 pg/mL    Comment: Performed at [facility], [address] [place], [place] [address]  Comprehensive metabolic panel     Status: Abnormal   Collection Time: 02/02/23 11:49 AM  Result Value Ref Range   Sodium 137 135 - 145 mmol/L   Potassium 3.3 (L) 3.5 - 5.1 mmol/L   Chloride 107 98 - 111 mmol/L   CO2 24 22 - 32 mmol/L   Glucose, Bld 141 (H) 70 - 99 mg/dL    Comment: Glucose reference range applies only to samples taken after fasting for at least 8 hours.   BUN 25 (H) 8 - 23 mg/dL   Creatinine, Ser 0.98 (H) 0.44 - 1.00 mg/dL   Calcium 8.2 (L) 8.9 - 10.3 mg/dL   Total Protein 5.8 (L) 6.5 - 8.1 g/dL   Albumin 3.5 3.5 - 5.0 g/dL   AST 18 15 - 41 U/L   ALT 13 0 - 44 U/L   Alkaline Phosphatase 39 38 - 126 U/L   Total Bilirubin 0.5 0.3 - 1.2 mg/dL   GFR, Estimated 47 (L) >60 mL/min    Comment: (NOTE) Calculated using the CKD-EPI Creatinine Equation (2021)    Anion gap 6 5 - 15    Comment: Performed at [facility], [address] [place], [place] [address]  Blood Culture (routine x 2)     Status: None   Collection Time: 02/02/23 11:59 AM   Specimen: BLOOD RIGHT ARM  Result Value Ref Range   Specimen Description BLOOD RIGHT ARM    Special Requests      BOTTLES DRAWN AEROBIC AND ANAEROBIC Blood Culture adequate volume   Culture      NO GROWTH 5 DAYS Performed at [facility], [address] [place], [place] [address]    Report Status 02/07/2023 FINAL   Blood Culture (routine x 2)     Status: None   Collection Time: 02/02/23 12:00 PM   Specimen: BLOOD LEFT ARM  Result Value Ref Range   Specimen Description BLOOD LEFT ARM    Special Requests      BOTTLES DRAWN AEROBIC AND ANAEROBIC Blood Culture  results may not be optimal due to an excessive volume of blood received in culture bottles   Culture      NO GROWTH 5 DAYS Performed at [facility], [address] [place], [place] [address]    Report Status 02/07/2023 FINAL   Lactic acid, plasma     Status: None   Collection Time: 02/02/23  1:48 PM  Result Value Ref Range   Lactic Acid, Venous 0.9 0.5 - 1.9 mmol/L    Comment: Performed at [facility], [address] [place], [place] [address]  Urinalysis, w/ Reflex to Culture (Infection Suspected) -Urine, Clean Catch     Status: Abnormal   Collection Time: 02/02/23  1:48 PM  Result Value Ref Range   Specimen Source URINE, CLEAN CATCH    Color, Urine YELLOW (A) YELLOW   APPearance CLEAR (A) CLEAR   Specific Gravity, Urine 1.013 1.005 - 1.030   pH 6.0 5.0 - 8.0   Glucose, UA NEGATIVE  NEGATIVE mg/dL   Hgb urine dipstick NEGATIVE NEGATIVE   Bilirubin Urine NEGATIVE NEGATIVE   Ketones, ur NEGATIVE NEGATIVE mg/dL   Protein, ur NEGATIVE NEGATIVE mg/dL   Nitrite NEGATIVE NEGATIVE   Leukocytes,Ua MODERATE (A) NEGATIVE   RBC / HPF 0-5 0 - 5 RBC/hpf   WBC, UA 0-5 0 - 5 WBC/hpf    Comment:        Reflex urine culture not performed if WBC <=10, OR if Squamous epithelial cells >5. If Squamous epithelial cells >5 suggest recollection.    Bacteria, UA NONE SEEN NONE SEEN   Squamous Epithelial / HPF 0-5 0 - 5 /HPF   Mucus PRESENT     Comment: Performed at [facility], [address] [place], [place] [address]  Troponin I (High Sensitivity)     Status: None   Collection Time: 02/02/23  1:48 PM  Result Value Ref Range   Troponin I (High Sensitivity) 6 <18 ng/L    Comment: (NOTE) Elevated high sensitivity troponin I (hsTnI) values and significant  changes across serial measurements may suggest ACS but many other  chronic and acute conditions are known to elevate hsTnI results.  Refer to the "Links" section for chest pain algorithms and additional   guidance. Performed at [facility], [address] [place], [place] [address]   Procalcitonin     Status: None   Collection Time: 02/02/23  1:48 PM  Result Value Ref Range   Procalcitonin <0.10 ng/mL    Comment:        Interpretation: PCT (Procalcitonin) <= 0.5 ng/mL: Systemic infection (sepsis) is not likely. Local bacterial infection is possible. (NOTE)       Sepsis PCT Algorithm           Lower Respiratory Tract                                      Infection PCT Algorithm    ----------------------------     ----------------------------         PCT < 0.25 ng/mL                PCT < 0.10 ng/mL          Strongly encourage             Strongly discourage   discontinuation of antibiotics    initiation of antibiotics    ----------------------------     -----------------------------       PCT 0.25 - 0.50 ng/mL            PCT 0.10 - 0.25 ng/mL               OR       >80% decrease in PCT            Discourage initiation of                                            antibiotics      Encourage discontinuation           of antibiotics    ----------------------------     -----------------------------         PCT >= 0.50 ng/mL              PCT 0.26 - 0.50 ng/mL  AND        <80% decrease in PCT             Encourage initiation of                                             antibiotics       Encourage continuation           of antibiotics    ----------------------------     -----------------------------        PCT >= 0.50 ng/mL                  PCT > 0.50 ng/mL               AND         increase in PCT                  Strongly encourage                                      initiation of antibiotics    Strongly encourage escalation           of antibiotics                                     -----------------------------                                           PCT <= 0.25 ng/mL                                                 OR                                         > 80% decrease in PCT                                      Discontinue / Do not initiate                                             antibiotics  Performed at [facility], [address] [place], [place] [address]   SARS Coronavirus 2 by RT PCR (hospital order, performed in [facility] hospital lab) *cepheid single result test* Anterior Nasal Swab     Status: None   Collection Time: 02/02/23  3:06 PM   Specimen: Anterior Nasal Swab  Result Value Ref Range   SARS Coronavirus 2 by RT PCR NEGATIVE NEGATIVE    Comment: (NOTE) SARS-CoV-2 target nucleic acids are NOT DETECTED.  The SARS-CoV-2 RNA is generally detectable in upper and lower respiratory specimens during the acute phase  of infection. The lowest concentration of SARS-CoV-2 viral copies this assay can detect is 250 copies / mL. A negative result does not preclude SARS-CoV-2 infection and should not be used as the sole basis for treatment or other patient management decisions.  A negative result may occur with improper specimen collection / handling, submission of specimen other than nasopharyngeal swab, presence of viral mutation(Ethan Kasperski) within the areas targeted by this assay, and inadequate number of viral copies (<250 copies / mL). A negative result must be combined with clinical observations, patient history, and epidemiological information.  Fact Sheet for Patients:   RoadLapTop.co.za  Fact Sheet for Healthcare Providers: http://kim-miller.com/  This test is not yet approved or  cleared by the [place] FDA and has been authorized for detection and/or diagnosis of SARS-CoV-2 by FDA under an Emergency Use Authorization (EUA).  This EUA will remain in effect (meaning this test can be used) for the duration of the COVID-19 declaration under Section 564(b)(1) of the Act, 21 U.Charissa Knowles.C. section 360bbb-3(b)(1), unless the authorization is terminated or revoked  sooner.  Performed at [facility], [address] [place], [place] [address]   MRSA Next Gen by PCR, Nasal     Status: None   Collection Time: 02/02/23  4:57 PM   Specimen: Nasal Mucosa; Nasal Swab  Result Value Ref Range   MRSA by PCR Next Gen NOT DETECTED NOT DETECTED    Comment: (NOTE) The GeneXpert MRSA Assay (FDA approved for NASAL specimens only), is one component of a comprehensive MRSA colonization surveillance program. It is not intended to diagnose MRSA infection nor to guide or monitor treatment for MRSA infections. Test performance is not FDA approved in patients less than 57 years old. Performed at [facility], [address] [place], [place] [address]   Cortisol     Status: None   Collection Time: 02/02/23  5:57 PM  Result Value Ref Range   Cortisol, Plasma 3.9 ug/dL    Comment: (NOTE) AM    6.7 - 22.6 ug/dL PM   <82.9       ug/dL Performed at [facility], [address] [place], [place] [address]   Lactic acid, plasma     Status: None   Collection Time: 02/02/23  5:57 PM  Result Value Ref Range   Lactic Acid, Venous 0.7 0.5 - 1.9 mmol/L    Comment: Performed at [facility], [address] [place], [place] [address]  Lactic acid, plasma     Status: None   Collection Time: 02/02/23  9:12 PM  Result Value Ref Range   Lactic Acid, Venous 1.0 0.5 - 1.9 mmol/L    Comment: Performed at [facility], [address] [place], [place] [address]  Lactic acid, plasma     Status: None   Collection Time: 02/03/23 12:18 AM  Result Value Ref Range   Lactic Acid, Venous 1.4 0.5 - 1.9 mmol/L    Comment: Performed at [facility], [address] [place], [place] [address]  Procalcitonin     Status: None   Collection Time: 02/03/23  4:31 AM  Result Value Ref Range   Procalcitonin <0.10 ng/mL    Comment:        Interpretation: PCT (Procalcitonin) <= 0.5 ng/mL: Systemic infection (sepsis) is not  likely. Local bacterial infection is possible. (NOTE)       Sepsis PCT Algorithm           Lower Respiratory Tract  Infection PCT Algorithm    ----------------------------     ----------------------------         PCT < 0.25 ng/mL                PCT < 0.10 ng/mL          Strongly encourage             Strongly discourage   discontinuation of antibiotics    initiation of antibiotics    ----------------------------     -----------------------------       PCT 0.25 - 0.50 ng/mL            PCT 0.10 - 0.25 ng/mL               OR       >80% decrease in PCT            Discourage initiation of                                            antibiotics      Encourage discontinuation           of antibiotics    ----------------------------     -----------------------------         PCT >= 0.50 ng/mL              PCT 0.26 - 0.50 ng/mL               AND        <80% decrease in PCT             Encourage initiation of                                             antibiotics       Encourage continuation           of antibiotics    ----------------------------     -----------------------------        PCT >= 0.50 ng/mL                  PCT > 0.50 ng/mL               AND         increase in PCT                  Strongly encourage                                      initiation of antibiotics    Strongly encourage escalation           of antibiotics                                     -----------------------------                                           PCT <= 0.25 ng/mL  OR                                        > 80% decrease in PCT                                      Discontinue / Do not initiate                                             antibiotics  Performed at [facility], [address] [place], [place] [address]   CBC     Status: Abnormal   Collection Time: 02/03/23  4:31 AM  Result Value Ref  Range   WBC 8.4 4.0 - 10.5 K/uL   RBC 3.50 (L) 3.87 - 5.11 MIL/uL   Hemoglobin 10.7 (L) 12.0 - 15.0 g/dL   HCT 60.4 (L) 54.0 - 98.1 %   MCV 92.6 80.0 - 100.0 fL   MCH 30.6 26.0 - 34.0 pg   MCHC 33.0 30.0 - 36.0 g/dL   RDW 19.1 47.8 - 29.5 %   Platelets 252 150 - 400 K/uL   nRBC 0.0 0.0 - 0.2 %    Comment: Performed at [facility], [address] [place], [place] [address]  Basic metabolic panel     Status: Abnormal   Collection Time: 02/03/23  4:31 AM  Result Value Ref Range   Sodium 140 135 - 145 mmol/L    Comment: ELECTROLYTES REPEATED TO VERIFY ASW   Potassium 3.9 3.5 - 5.1 mmol/L   Chloride 111 98 - 111 mmol/L   CO2 28 22 - 32 mmol/L   Glucose, Bld 95 70 - 99 mg/dL    Comment: Glucose reference range applies only to samples taken after fasting for at least 8 hours.   BUN 18 8 - 23 mg/dL   Creatinine, Ser 8.65 0.44 - 1.00 mg/dL   Calcium 8.2 (L) 8.9 - 10.3 mg/dL   GFR, Estimated 60 (L) >60 mL/min    Comment: (NOTE) Calculated using the CKD-EPI Creatinine Equation (2021)    Anion gap 1 (L) 5 - 15    Comment: Performed at [facility], [address] [place], [place] [address]  Renal function panel     Status: Abnormal   Collection Time: 02/03/23  4:33 AM  Result Value Ref Range   Sodium 140 135 - 145 mmol/L    Comment: ELECTROLYTES REPEATED TO VERIFY ASW   Potassium 3.8 3.5 - 5.1 mmol/L   Chloride 111 98 - 111 mmol/L   CO2 28 22 - 32 mmol/L   Glucose, Bld 97 70 - 99 mg/dL    Comment: Glucose reference range applies only to samples taken after fasting for at least 8 hours.   BUN 18 8 - 23 mg/dL   Creatinine, Ser 6.29 0.44 - 1.00 mg/dL   Calcium 8.1 (L) 8.9 - 10.3 mg/dL   Phosphorus 2.8 2.5 - 4.6 mg/dL   Albumin 3.3 (L) 3.5 - 5.0 g/dL   GFR, Estimated 58 (L) >60 mL/min    Comment: (NOTE) Calculated using the CKD-EPI Creatinine Equation (2021)    Anion gap 1 (L) 5 - 15    Comment:  Performed at [facility], [address]  [place], [place] [address]  Procalcitonin     Status: None   Collection Time: 02/04/23  5:10 AM  Result Value Ref Range   Procalcitonin <0.10 ng/mL    Comment:        Interpretation: PCT (Procalcitonin) <= 0.5 ng/mL: Systemic infection (sepsis) is not likely. Local bacterial infection is possible. (NOTE)       Sepsis PCT Algorithm           Lower Respiratory Tract                                      Infection PCT Algorithm    ----------------------------     ----------------------------         PCT < 0.25 ng/mL                PCT < 0.10 ng/mL          Strongly encourage             Strongly discourage   discontinuation of antibiotics    initiation of antibiotics    ----------------------------     -----------------------------       PCT 0.25 - 0.50 ng/mL            PCT 0.10 - 0.25 ng/mL               OR       >80% decrease in PCT            Discourage initiation of                                            antibiotics      Encourage discontinuation           of antibiotics    ----------------------------     -----------------------------         PCT >= 0.50 ng/mL              PCT 0.26 - 0.50 ng/mL               AND        <80% decrease in PCT             Encourage initiation of                                             antibiotics       Encourage continuation           of antibiotics    ----------------------------     -----------------------------        PCT >= 0.50 ng/mL                  PCT > 0.50 ng/mL               AND         increase in PCT                  Strongly encourage  initiation of antibiotics    Strongly encourage escalation           of antibiotics                                     -----------------------------                                           PCT <= 0.25 ng/mL                                                 OR                                        > 80% decrease in PCT                                       Discontinue / Do not initiate                                             antibiotics  Performed at [facility], [address] [place], [place] [address]   CBC     Status: Abnormal   Collection Time: 02/04/23  5:10 AM  Result Value Ref Range   WBC 7.8 4.0 - 10.5 K/uL   RBC 3.74 (L) 3.87 - 5.11 MIL/uL   Hemoglobin 11.4 (L) 12.0 - 15.0 g/dL   HCT 21.3 (L) 08.6 - 57.8 %   MCV 90.4 80.0 - 100.0 fL   MCH 30.5 26.0 - 34.0 pg   MCHC 33.7 30.0 - 36.0 g/dL   RDW 46.9 62.9 - 52.8 %   Platelets 283 150 - 400 K/uL   nRBC 0.0 0.0 - 0.2 %    Comment: Performed at [facility], [address], [place], [place] [address]  Renal function panel     Status: Abnormal   Collection Time: 02/04/23  5:10 AM  Result Value Ref Range   Sodium 140 135 - 145 mmol/L   Potassium 3.6 3.5 - 5.1 mmol/L   Chloride 107 98 - 111 mmol/L   CO2 26 22 - 32 mmol/L   Glucose, Bld 89 70 - 99 mg/dL    Comment: Glucose reference range applies only to samples taken after fasting for at least 8 hours.   BUN 14 8 - 23 mg/dL   Creatinine, Ser 4.01 0.44 - 1.00 mg/dL   Calcium 9.0 8.9 - 02.7 mg/dL   Phosphorus 3.9 2.5 - 4.6 mg/dL   Albumin 3.5 3.5 - 5.0 g/dL   GFR, Estimated 60 (L) >60 mL/min    Comment: (NOTE) Calculated using the CKD-EPI Creatinine Equation (2021)    Anion gap 7 5 - 15    Comment: Performed at [facility], [address] [place], [place] [address]  Lipid panel     Status: Abnormal   Collection  Time: 03/27/23  8:49 AM  Result Value Ref Range   Cholesterol, Total 127 100 - 199 mg/dL   Triglycerides 284 (H) 0 - 149 mg/dL   HDL 41 >13 mg/dL   VLDL Cholesterol Cal 26 5 - 40 mg/dL   LDL Chol Calc (NIH) 60 0 - 99 mg/dL   Chol/HDL Ratio 3.1 0.0 - 4.4 ratio    Comment:                                   T. Chol/HDL Ratio                                             Men  Women                               1/2 Avg.Risk  3.4    3.3                                   Avg.Risk   5.0    4.4                                2X Avg.Risk  9.6    7.1                                3X Avg.Risk 23.4   11.0   Hemoglobin A1c     Status: Abnormal   Collection Time: 03/27/23  8:49 AM  Result Value Ref Range   Hgb A1c MFr Bld 5.9 (H) 4.8 - 5.6 %    Comment:          Prediabetes: 5.7 - 6.4          Diabetes: >6.4          Glycemic control for adults with diabetes: <7.0    Est. average glucose Bld gHb Est-mCnc 123 mg/dL  Comprehensive metabolic panel     Status: Abnormal   Collection Time: 03/27/23  8:49 AM  Result Value Ref Range   Glucose 98 70 - 99 mg/dL   BUN 29 (H) 8 - 27 mg/dL   Creatinine, Ser 2.44 (H) 0.57 - 1.00 mg/dL   eGFR 38 (L) >01 UU/VOZ/3.66   BUN/Creatinine Ratio 21 12 - 28   Sodium 143 134 - 144 mmol/L   Potassium 4.0 3.5 - 5.2 mmol/L   Chloride 102 96 - 106 mmol/L   CO2 29 20 - 29 mmol/L   Calcium 9.8 8.7 - 10.3 mg/dL   Total Protein 6.8 6.0 - 8.5 g/dL   Albumin 4.6 3.7 - 4.7 g/dL   Globulin, Total 2.2 1.5 - 4.5 g/dL   Bilirubin Total 0.3 0.0 - 1.2 mg/dL   Alkaline Phosphatase 60 44 - 121 IU/L   AST 18 0 - 40 IU/L   ALT 17 0 - 32 IU/L  BMP8+Anion Gap     Status: Abnormal   Collection Time: 04/14/23  1:02 PM  Result Value Ref Range   Glucose 88 70 - 99 mg/dL   BUN 17 8 - 27  mg/dL   Creatinine, Ser 1.61 (H) 0.57 - 1.00 mg/dL   eGFR 46 (L) >09 UE/AVW/0.98   BUN/Creatinine Ratio 14 12 - 28   Sodium 141 134 - 144 mmol/L   Potassium 4.3 3.5 - 5.2 mmol/L   Chloride 103 96 - 106 mmol/L   CO2 23 20 - 29 mmol/L   Anion Gap 15.0 10.0 - 18.0 mmol/L   Calcium 9.7 8.7 - 10.3 mg/dL      Assessment & Plan:  As per problem list  Problem List Items Addressed This Visit       Cardiovascular and Mediastinum   Primary hypertension   Relevant Orders   CBC With Diff/Platelet     Respiratory   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   Total time spent: 20 minutes  Luna Fuse, MD  04/18/2023   This document may have been prepared by [facility] Voice Recognition software and as such may include unintentional dictation errors.

## 2023-05-08 DIAGNOSIS — M47896 Other spondylosis, lumbar region: Secondary | ICD-10-CM | POA: Diagnosis not present

## 2023-05-08 DIAGNOSIS — M4316 Spondylolisthesis, lumbar region: Secondary | ICD-10-CM | POA: Diagnosis not present

## 2023-05-08 DIAGNOSIS — M47816 Spondylosis without myelopathy or radiculopathy, lumbar region: Secondary | ICD-10-CM | POA: Diagnosis not present

## 2023-05-08 DIAGNOSIS — M5416 Radiculopathy, lumbar region: Secondary | ICD-10-CM | POA: Diagnosis not present

## 2023-05-25 DIAGNOSIS — M47896 Other spondylosis, lumbar region: Secondary | ICD-10-CM | POA: Diagnosis not present

## 2023-06-29 ENCOUNTER — Other Ambulatory Visit: Payer: Self-pay | Admitting: Internal Medicine

## 2023-06-29 DIAGNOSIS — I10 Essential (primary) hypertension: Secondary | ICD-10-CM

## 2023-06-29 DIAGNOSIS — E782 Mixed hyperlipidemia: Secondary | ICD-10-CM

## 2023-07-06 ENCOUNTER — Other Ambulatory Visit: Payer: Self-pay

## 2023-07-14 ENCOUNTER — Other Ambulatory Visit: Payer: Medicare Other

## 2023-07-14 DIAGNOSIS — E782 Mixed hyperlipidemia: Secondary | ICD-10-CM | POA: Diagnosis not present

## 2023-07-14 DIAGNOSIS — I10 Essential (primary) hypertension: Secondary | ICD-10-CM | POA: Diagnosis not present

## 2023-07-14 DIAGNOSIS — R7303 Prediabetes: Secondary | ICD-10-CM

## 2023-07-15 LAB — COMPREHENSIVE METABOLIC PANEL
ALT: 14 [IU]/L (ref 0–32)
AST: 18 [IU]/L (ref 0–40)
Albumin: 4.2 g/dL (ref 3.7–4.7)
Alkaline Phosphatase: 57 [IU]/L (ref 44–121)
BUN/Creatinine Ratio: 23 (ref 12–28)
BUN: 37 mg/dL — ABNORMAL HIGH (ref 8–27)
Bilirubin Total: 0.3 mg/dL (ref 0.0–1.2)
CO2: 27 mmol/L (ref 20–29)
Calcium: 9.6 mg/dL (ref 8.7–10.3)
Chloride: 104 mmol/L (ref 96–106)
Creatinine, Ser: 1.63 mg/dL — ABNORMAL HIGH (ref 0.57–1.00)
Globulin, Total: 1.9 g/dL (ref 1.5–4.5)
Glucose: 93 mg/dL (ref 70–99)
Potassium: 4.1 mmol/L (ref 3.5–5.2)
Sodium: 142 mmol/L (ref 134–144)
Total Protein: 6.1 g/dL (ref 6.0–8.5)
eGFR: 31 mL/min/{1.73_m2} — ABNORMAL LOW (ref >59)

## 2023-07-15 LAB — CBC WITH DIFF/PLATELET
Basophils Absolute: 0.1 10*3/uL (ref 0.0–0.2)
Basos: 1 %
EOS (ABSOLUTE): 0.2 10*3/uL (ref 0.0–0.4)
Eos: 2 %
Hematocrit: 37.5 % (ref 34.0–46.6)
Hemoglobin: 12 g/dL (ref 11.1–15.9)
Immature Grans (Abs): 0 10*3/uL (ref 0.0–0.1)
Immature Granulocytes: 0 %
Lymphocytes Absolute: 2.8 10*3/uL (ref 0.7–3.1)
Lymphs: 39 %
MCH: 30.3 pg (ref 26.6–33.0)
MCHC: 32 g/dL (ref 31.5–35.7)
MCV: 95 fL (ref 79–97)
Monocytes Absolute: 0.5 10*3/uL (ref 0.1–0.9)
Monocytes: 7 %
Neutrophils Absolute: 3.7 10*3/uL (ref 1.4–7.0)
Neutrophils: 51 %
Platelets: 368 10*3/uL (ref 150–450)
RBC: 3.96 x10E6/uL (ref 3.77–5.28)
RDW: 12.3 % (ref 11.7–15.4)
WBC: 7.2 10*3/uL (ref 3.4–10.8)

## 2023-07-15 LAB — LIPID PANEL
Chol/HDL Ratio: 3.1 {ratio} (ref 0.0–4.4)
Cholesterol, Total: 127 mg/dL (ref 100–199)
HDL: 41 mg/dL (ref >39)
LDL Chol Calc (NIH): 66 mg/dL (ref 0–99)
Triglycerides: 106 mg/dL (ref 0–149)
VLDL Cholesterol Cal: 20 mg/dL (ref 5–40)

## 2023-07-15 LAB — HEMOGLOBIN A1C
Est. average glucose Bld gHb Est-mCnc: 120 mg/dL
Hgb A1c MFr Bld: 5.8 % — ABNORMAL HIGH (ref 4.8–5.6)

## 2023-07-18 ENCOUNTER — Encounter: Payer: Self-pay | Admitting: Internal Medicine

## 2023-07-18 ENCOUNTER — Ambulatory Visit (INDEPENDENT_AMBULATORY_CARE_PROVIDER_SITE_OTHER): Payer: Medicare Other | Admitting: Internal Medicine

## 2023-07-18 VITALS — BP 132/84 | HR 90 | Ht 62.0 in | Wt 164.0 lb

## 2023-07-18 DIAGNOSIS — G4709 Other insomnia: Secondary | ICD-10-CM

## 2023-07-18 DIAGNOSIS — I10 Essential (primary) hypertension: Secondary | ICD-10-CM | POA: Diagnosis not present

## 2023-07-18 DIAGNOSIS — J301 Allergic rhinitis due to pollen: Secondary | ICD-10-CM | POA: Diagnosis not present

## 2023-07-18 DIAGNOSIS — N951 Menopausal and female climacteric states: Secondary | ICD-10-CM

## 2023-07-18 DIAGNOSIS — E782 Mixed hyperlipidemia: Secondary | ICD-10-CM | POA: Diagnosis not present

## 2023-07-18 DIAGNOSIS — N184 Chronic kidney disease, stage 4 (severe): Secondary | ICD-10-CM | POA: Insufficient documentation

## 2023-07-18 DIAGNOSIS — K5904 Chronic idiopathic constipation: Secondary | ICD-10-CM | POA: Diagnosis not present

## 2023-07-18 DIAGNOSIS — F3289 Other specified depressive episodes: Secondary | ICD-10-CM

## 2023-07-18 MED ORDER — CETIRIZINE HCL 10 MG PO TABS: 10.0000 mg | ORAL_TABLET | Freq: Every day | ORAL | 2 refills | Status: DC

## 2023-07-18 MED ORDER — ROSUVASTATIN CALCIUM 5 MG PO TABS: 5.0000 mg | ORAL_TABLET | Freq: Every evening | ORAL | 0 refills | Status: DC

## 2023-07-18 MED ORDER — CHLORTHALIDONE 25 MG PO TABS: 25.0000 mg | ORAL_TABLET | Freq: Every day | ORAL | 1 refills | Status: DC

## 2023-07-18 MED ORDER — CLONIDINE HCL 0.1 MG PO TABS: 0.1000 mg | ORAL_TABLET | Freq: Every evening | ORAL | 1 refills | Status: DC

## 2023-07-18 MED ORDER — LINACLOTIDE 72 MCG PO CAPS: 72.0000 ug | ORAL_CAPSULE | Freq: Every day | ORAL | 0 refills | Status: DC

## 2023-07-18 MED ORDER — MIRTAZAPINE 15 MG PO TABS: 15.0000 mg | ORAL_TABLET | Freq: Every day | ORAL | 1 refills | Status: DC

## 2023-07-18 MED ORDER — AMLODIPINE BESY-BENAZEPRIL HCL 10-20 MG PO CAPS: 1.0000 | ORAL_CAPSULE | Freq: Every day | ORAL | 1 refills | Status: DC

## 2023-07-18 NOTE — Progress Notes (Unsigned)
Established Patient Office Visit  Subjective:  Patient ID: Deborah Hurst, female    DOB: November 06, 1940  Age: 82 y.o. MRN: [id number]  Chief Complaint  Patient presents with  . Follow-up    3 month follow up    No new complaints, here for lab review and medication refills. Only c/o insomnia whereby she wakes up in the early morning and has difficulty falling asleep again.   No other concerns at this time.   Past Medical History:  Diagnosis Date  . GERD (gastroesophageal reflux disease)   . Hyperlipidemia   . Hypertension     Past Surgical History:  Procedure Laterality Date  . ABDOMINAL HYSTERECTOMY    . CHOLECYSTECTOMY    . KNEE SURGERY Right     Social History   Socioeconomic History  . Marital status: Married    Spouse name: Not on file  . Number of children: Not on file  . Years of education: Not on file  . Highest education level: Not on file  Occupational History  . Not on file  Tobacco Use  . Smoking status: Never  . Smokeless tobacco: Never  Vaping Use  . Vaping status: Never Used  Substance and Sexual Activity  . Alcohol use: Never  . Drug use: Never  . Sexual activity: Not on file  Other Topics Concern  . Not on file  Social History Narrative  . Not on file   Social Determinants of Health   Financial Resource Strain: Not on file  Food Insecurity: No Food Insecurity (02/02/2023)   Hunger Vital Sign   . Worried About Programme researcher, broadcasting/film/video in the Last Year: Never true   . Ran Out of Food in the Last Year: Never true  Transportation Needs: No Transportation Needs (02/02/2023)   PRAPARE - Transportation   . Lack of Transportation (Medical): No   . Lack of Transportation (Non-Medical): No  Physical Activity: Not on file  Stress: Not on file  Social Connections: Not on file  Intimate Partner Violence: Not At Risk (02/02/2023)   Humiliation, Afraid, Rape, and Kick questionnaire   . Fear of Current or Ex-Partner: No   . Emotionally Abused: No    . Physically Abused: No   . Sexually Abused: No    Family History  Problem Relation Age of Onset  . Heart attack Mother 38  . Prostate cancer Father   . Breast cancer Neg Hx     No Known Allergies  Outpatient Medications Prior to Visit  Medication Sig  . Azelastine HCl 137 MCG/SPRAY SOLN Place 1 spray into the nose daily.  . fluticasone (FLONASE) 50 MCG/ACT nasal spray   . [DISCONTINUED] amLODipine-benazepril (LOTREL) 10-20 MG capsule Take 1 capsule by mouth daily.  . [DISCONTINUED] chlorthalidone (HYGROTON) 25 MG tablet Take 1 tablet (25 mg total) by mouth daily.  . [DISCONTINUED] cloNIDine (CATAPRES) 0.1 MG tablet Take 1 tablet (0.1 mg total) by mouth at bedtime.  . [DISCONTINUED] linaclotide (LINZESS) 72 MCG capsule Take 1 capsule (72 mcg total) by mouth daily before breakfast. TAKE 1 CAPSULE BY MOUTH EVERY DAY IN THE MORNING  . [DISCONTINUED] mirtazapine (REMERON) 15 MG tablet Take 1 tablet (15 mg total) by mouth at bedtime.  . [DISCONTINUED] rosuvastatin (CRESTOR) 5 MG tablet Take 1 tablet (5 mg total) by mouth at bedtime.  . [DISCONTINUED] cetirizine (ZYRTEC ALLERGY) 10 MG tablet Take 1 tablet (10 mg total) by mouth daily. (Patient not taking: Reported on 07/18/2023)  . [DISCONTINUED] gabapentin (NEURONTIN)  300 MG capsule Take 300 mg by mouth 2 (two) times daily. (Patient not taking: Reported on 07/18/2023)  . [DISCONTINUED] pantoprazole (PROTONIX) 40 MG tablet Take by mouth. (Patient not taking: Reported on 07/18/2023)   No facility-administered medications prior to visit.    ROS     Objective:   BP 132/84   Pulse 90   Ht 5\' 2"  (1.575 m)   Wt 164 lb (74.4 kg)   SpO2 97%   BMI 30.00 kg/m   Vitals:   07/18/23 1051  BP: 132/84  Pulse: 90  Height: 5\' 2"  (1.575 m)  Weight: 164 lb (74.4 kg)  SpO2: 97%  BMI (Calculated): 29.99    Physical Exam   No results found for any visits on 07/18/23.  Recent Results (from the past 2160 hour(Shalae Belmonte))  Hemoglobin A1c      Status: Abnormal   Collection Time: 07/14/23 10:39 AM  Result Value Ref Range   Hgb A1c MFr Bld 5.8 (H) 4.8 - 5.6 %    Comment:          Prediabetes: 5.7 - 6.4          Diabetes: >6.4          Glycemic control for adults with diabetes: <7.0    Est. average glucose Bld gHb Est-mCnc 120 mg/dL  Lipid panel     Status: None   Collection Time: 07/14/23 10:39 AM  Result Value Ref Range   Cholesterol, Total 127 100 - 199 mg/dL   Triglycerides 093 0 - 149 mg/dL   HDL 41 >23 mg/dL   VLDL Cholesterol Cal 20 5 - 40 mg/dL   LDL Chol Calc (NIH) 66 0 - 99 mg/dL   Chol/HDL Ratio 3.1 0.0 - 4.4 ratio    Comment:                                   T. Chol/HDL Ratio                                             Men  Women                               1/2 Avg.Risk  3.4    3.3                                   Avg.Risk  5.0    4.4                                2X Avg.Risk  9.6    7.1                                3X Avg.Risk 23.4   11.0   Comprehensive metabolic panel     Status: Abnormal   Collection Time: 07/14/23 10:39 AM  Result Value Ref Range   Glucose 93 70 - 99 mg/dL   BUN 37 (H) 8 - 27 mg/dL   Creatinine, Ser 5.57 (H) 0.57 - 1.00 mg/dL   eGFR 31 (L) >32  mL/min/1.73   BUN/Creatinine Ratio 23 12 - 28   Sodium 142 134 - 144 mmol/L   Potassium 4.1 3.5 - 5.2 mmol/L   Chloride 104 96 - 106 mmol/L   CO2 27 20 - 29 mmol/L   Calcium 9.6 8.7 - 10.3 mg/dL   Total Protein 6.1 6.0 - 8.5 g/dL   Albumin 4.2 3.7 - 4.7 g/dL   Globulin, Total 1.9 1.5 - 4.5 g/dL   Bilirubin Total 0.3 0.0 - 1.2 mg/dL   Alkaline Phosphatase 57 44 - 121 IU/L   AST 18 0 - 40 IU/L   ALT 14 0 - 32 IU/L  CBC With Diff/Platelet     Status: None   Collection Time: 07/14/23 10:39 AM  Result Value Ref Range   WBC 7.2 3.4 - 10.8 x10E3/uL    Comment: **Effective July 17, 2023 profile 005025 WBC will be made**   non-orderable as a stand-alone order code.    RBC 3.96 3.77 - 5.28 x10E6/uL   Hemoglobin 12.0 11.1 - 15.9 g/dL    Hematocrit 40.9 81.1 - 46.6 %   MCV 95 79 - 97 fL   MCH 30.3 26.6 - 33.0 pg   MCHC 32.0 31.5 - 35.7 g/dL   RDW 91.4 78.2 - 95.6 %   Platelets 368 150 - 450 x10E3/uL   Neutrophils 51 Not Estab. %   Lymphs 39 Not Estab. %   Monocytes 7 Not Estab. %   Eos 2 Not Estab. %   Basos 1 Not Estab. %   Neutrophils Absolute 3.7 1.4 - 7.0 x10E3/uL   Lymphocytes Absolute 2.8 0.7 - 3.1 x10E3/uL   Monocytes Absolute 0.5 0.1 - 0.9 x10E3/uL   EOS (ABSOLUTE) 0.2 0.0 - 0.4 x10E3/uL   Basophils Absolute 0.1 0.0 - 0.2 x10E3/uL   Immature Granulocytes 0 Not Estab. %   Immature Grans (Abs) 0.0 0.0 - 0.1 x10E3/uL      Assessment & Plan:  As per problem list. Will try otc Melatonin first. Problem List Items Addressed This Visit       Cardiovascular and Mediastinum   Primary hypertension   Relevant Medications   rosuvastatin (CRESTOR) 5 MG tablet   amLODipine-benazepril (LOTREL) 10-20 MG capsule   chlorthalidone (HYGROTON) 25 MG tablet   cloNIDine (CATAPRES) 0.1 MG tablet   Hot flashes due to menopause   Relevant Medications   rosuvastatin (CRESTOR) 5 MG tablet   amLODipine-benazepril (LOTREL) 10-20 MG capsule   chlorthalidone (HYGROTON) 25 MG tablet   cloNIDine (CATAPRES) 0.1 MG tablet     Respiratory   Seasonal allergic rhinitis due to pollen   Relevant Medications   cetirizine (ZYRTEC ALLERGY) 10 MG tablet     Digestive   Chronic idiopathic constipation   Relevant Medications   linaclotide (LINZESS) 72 MCG capsule     Genitourinary   CKD (chronic kidney disease) stage 4, GFR 15-29 ml/min (HCC) - Primary   Relevant Orders   Ambulatory referral to Nephrology   US Renal   POCT Urinalysis Dipstick (81002)   POC CREATINE & ALBUMIN,URINE     Other   Mixed hyperlipidemia   Relevant Medications   rosuvastatin (CRESTOR) 5 MG tablet   amLODipine-benazepril (LOTREL) 10-20 MG capsule   chlorthalidone (HYGROTON) 25 MG tablet   cloNIDine (CATAPRES) 0.1 MG tablet   Other Relevant Orders    Lipid panel   Comprehensive metabolic panel   CK   Depression   Relevant Medications   mirtazapine (REMERON) 15 MG tablet   Other  Visit Diagnoses     Other insomnia           Return in about 3 months (around 10/16/2023) for fu with labs prior.   Total time spent: 20 minutes  Luna Fuse, MD  07/18/2023   This document may have been prepared by [facility] Voice Recognition software and as such may include unintentional dictation errors.

## 2023-07-26 ENCOUNTER — Ambulatory Visit: Payer: Medicare Other

## 2023-07-26 ENCOUNTER — Ambulatory Visit (INDEPENDENT_AMBULATORY_CARE_PROVIDER_SITE_OTHER): Payer: Medicare Other

## 2023-07-26 ENCOUNTER — Other Ambulatory Visit: Payer: Medicare Other

## 2023-07-26 DIAGNOSIS — E782 Mixed hyperlipidemia: Secondary | ICD-10-CM | POA: Diagnosis not present

## 2023-07-26 DIAGNOSIS — N184 Chronic kidney disease, stage 4 (severe): Secondary | ICD-10-CM | POA: Diagnosis not present

## 2023-07-26 LAB — POC CREATINE & ALBUMIN,URINE
Albumin/Creatinine Ratio, Urine, POC: <30
Creatinine, POC: 50 mg/dL
Microalbumin Ur, POC: 10 mg/L

## 2023-07-26 LAB — POCT URINALYSIS DIPSTICK
Bilirubin, UA: NEGATIVE
Blood, UA: NEGATIVE
Glucose, UA: NEGATIVE
Ketones, UA: NEGATIVE
Leukocytes, UA: NEGATIVE
Nitrite, UA: NEGATIVE
Protein, UA: NEGATIVE
Spec Grav, UA: 1.01 (ref 1.010–1.025)
Urobilinogen, UA: NEGATIVE U/dL — AB
pH, UA: 6.5 (ref 5.0–8.0)

## 2023-07-27 LAB — LIPID PANEL
Chol/HDL Ratio: 3 {ratio} (ref 0.0–4.4)
Cholesterol, Total: 130 mg/dL (ref 100–199)
HDL: 44 mg/dL (ref >39)
LDL Chol Calc (NIH): 68 mg/dL (ref 0–99)
Triglycerides: 97 mg/dL (ref 0–149)
VLDL Cholesterol Cal: 18 mg/dL (ref 5–40)

## 2023-07-27 LAB — COMPREHENSIVE METABOLIC PANEL
ALT: 15 [IU]/L (ref 0–32)
AST: 20 [IU]/L (ref 0–40)
Albumin: 4.6 g/dL (ref 3.7–4.7)
Alkaline Phosphatase: 63 [IU]/L (ref 44–121)
BUN/Creatinine Ratio: 14 (ref 12–28)
BUN: 20 mg/dL (ref 8–27)
Bilirubin Total: 0.2 mg/dL (ref 0.0–1.2)
CO2: 28 mmol/L (ref 20–29)
Calcium: 9.7 mg/dL (ref 8.7–10.3)
Chloride: 100 mmol/L (ref 96–106)
Creatinine, Ser: 1.42 mg/dL — ABNORMAL HIGH (ref 0.57–1.00)
Globulin, Total: 2.2 g/dL (ref 1.5–4.5)
Glucose: 91 mg/dL (ref 70–99)
Potassium: 3.8 mmol/L (ref 3.5–5.2)
Sodium: 141 mmol/L (ref 134–144)
Total Protein: 6.8 g/dL (ref 6.0–8.5)
eGFR: 37 mL/min/{1.73_m2} — ABNORMAL LOW (ref >59)

## 2023-07-27 LAB — CK: Total CK: 371 U/L — ABNORMAL HIGH (ref 26–161)

## 2023-08-22 DIAGNOSIS — H43821 Vitreomacular adhesion, right eye: Secondary | ICD-10-CM | POA: Diagnosis not present

## 2023-08-22 DIAGNOSIS — H2513 Age-related nuclear cataract, bilateral: Secondary | ICD-10-CM | POA: Diagnosis not present

## 2023-08-22 DIAGNOSIS — H40003 Preglaucoma, unspecified, bilateral: Secondary | ICD-10-CM | POA: Diagnosis not present

## 2023-08-27 DIAGNOSIS — K645 Perianal venous thrombosis: Secondary | ICD-10-CM | POA: Diagnosis not present

## 2023-08-27 DIAGNOSIS — I10 Essential (primary) hypertension: Secondary | ICD-10-CM | POA: Diagnosis not present

## 2023-09-07 DIAGNOSIS — E785 Hyperlipidemia, unspecified: Secondary | ICD-10-CM | POA: Diagnosis not present

## 2023-09-07 DIAGNOSIS — R8290 Unspecified abnormal findings in urine: Secondary | ICD-10-CM | POA: Diagnosis not present

## 2023-09-07 DIAGNOSIS — N1832 Chronic kidney disease, stage 3b: Secondary | ICD-10-CM | POA: Diagnosis not present

## 2023-09-07 DIAGNOSIS — I129 Hypertensive chronic kidney disease with stage 1 through stage 4 chronic kidney disease, or unspecified chronic kidney disease: Secondary | ICD-10-CM | POA: Diagnosis not present

## 2023-10-11 DIAGNOSIS — I129 Hypertensive chronic kidney disease with stage 1 through stage 4 chronic kidney disease, or unspecified chronic kidney disease: Secondary | ICD-10-CM | POA: Diagnosis not present

## 2023-10-11 DIAGNOSIS — E785 Hyperlipidemia, unspecified: Secondary | ICD-10-CM | POA: Diagnosis not present

## 2023-10-11 DIAGNOSIS — N1832 Chronic kidney disease, stage 3b: Secondary | ICD-10-CM | POA: Diagnosis not present

## 2023-10-24 ENCOUNTER — Ambulatory Visit (INDEPENDENT_AMBULATORY_CARE_PROVIDER_SITE_OTHER): Payer: Medicare Other | Admitting: Internal Medicine

## 2023-10-24 ENCOUNTER — Encounter: Payer: Self-pay | Admitting: Internal Medicine

## 2023-10-24 VITALS — BP 130/84 | HR 65 | Temp 98.4°F | Ht 62.0 in | Wt 162.0 lb

## 2023-10-24 DIAGNOSIS — S43401A Unspecified sprain of right shoulder joint, initial encounter: Secondary | ICD-10-CM

## 2023-10-24 DIAGNOSIS — I10 Essential (primary) hypertension: Secondary | ICD-10-CM

## 2023-10-24 DIAGNOSIS — J301 Allergic rhinitis due to pollen: Secondary | ICD-10-CM

## 2023-10-24 DIAGNOSIS — E782 Mixed hyperlipidemia: Secondary | ICD-10-CM

## 2023-10-24 DIAGNOSIS — M25511 Pain in right shoulder: Secondary | ICD-10-CM | POA: Diagnosis not present

## 2023-10-24 DIAGNOSIS — K5904 Chronic idiopathic constipation: Secondary | ICD-10-CM

## 2023-10-24 DIAGNOSIS — Z780 Asymptomatic menopausal state: Secondary | ICD-10-CM | POA: Diagnosis not present

## 2023-10-24 MED ORDER — CETIRIZINE HCL 10 MG PO TABS
10.0000 mg | ORAL_TABLET | Freq: Every day | ORAL | 2 refills | Status: DC
Start: 2023-10-24 — End: 2024-02-05

## 2023-10-24 MED ORDER — DICLOFENAC SODIUM 1 % EX GEL: 2.0000 g | Freq: Two times a day (BID) | CUTANEOUS | 1 refills | Status: AC

## 2023-10-24 MED ORDER — LINACLOTIDE 72 MCG PO CAPS: 72.0000 ug | ORAL_CAPSULE | Freq: Every day | ORAL | 0 refills | Status: DC

## 2023-10-24 MED ORDER — ROSUVASTATIN CALCIUM 5 MG PO TABS: 5.0000 mg | ORAL_TABLET | Freq: Every evening | ORAL | 0 refills | Status: DC

## 2023-10-24 NOTE — Progress Notes (Signed)
 Established Patient Office Visit  Subjective:  Patient ID: Deborah Hurst, female    DOB: 05-Oct-1940  Age: 83 y.o. MRN: [id number]  Chief Complaint  Patient presents with   Follow-up    3 month follow up     No new complaints, here for lab review and medication refills. C/o right shoulder discomfort while reaching for her seatbelt. Denies any arm weakness.     No other concerns at this time.   Past Medical History:  Diagnosis Date   GERD (gastroesophageal reflux disease)    Hyperlipidemia    Hypertension     Past Surgical History:  Procedure Laterality Date   ABDOMINAL HYSTERECTOMY     CHOLECYSTECTOMY     KNEE SURGERY Right     Social History   Socioeconomic History   Marital status: Married    Spouse name: Not on file   Number of children: Not on file   Years of education: Not on file   Highest education level: Not on file  Occupational History   Not on file  Tobacco Use   Smoking status: Never   Smokeless tobacco: Never  Vaping Use   Vaping status: Never Used  Substance and Sexual Activity   Alcohol use: Never   Drug use: Never   Sexual activity: Not on file  Other Topics Concern   Not on file  Social History Narrative   Not on file   Social Drivers of Health   Financial Resource Strain: Not on file  Food Insecurity: No Food Insecurity (02/02/2023)   Hunger Vital Sign    Worried About Running Out of Food in the Last Year: Never true    Ran Out of Food in the Last Year: Never true  Transportation Needs: No Transportation Needs (02/02/2023)   PRAPARE - Administrator, Civil Service (Medical): No    Lack of Transportation (Non-Medical): No  Physical Activity: Not on file  Stress: Not on file  Social Connections: Not on file  Intimate Partner Violence: Not At Risk (02/02/2023)   Humiliation, Afraid, Rape, and Kick questionnaire    Fear of Current or Ex-Partner: No    Emotionally Abused: No    Physically Abused: No    Sexually  Abused: No    Family History  Problem Relation Age of Onset   Heart attack Mother 66   Prostate cancer Father    Breast cancer Neg Hx     No Known Allergies  Outpatient Medications Prior to Visit  Medication Sig   losartan (COZAAR) 25 MG tablet Take 25 mg by mouth daily.   amLODipine-benazepril (LOTREL) 10-20 MG capsule Take 1 capsule by mouth daily.   Azelastine HCl 137 MCG/SPRAY SOLN Place 1 spray into the nose daily.   cloNIDine (CATAPRES) 0.1 MG tablet Take 1 tablet (0.1 mg total) by mouth at bedtime. (Patient not taking: Reported on 10/24/2023)   fluticasone (FLONASE) 50 MCG/ACT nasal spray    mirtazapine (REMERON) 15 MG tablet Take 1 tablet (15 mg total) by mouth at bedtime.   [DISCONTINUED] cetirizine (ZYRTEC ALLERGY) 10 MG tablet Take 1 tablet (10 mg total) by mouth daily.   [DISCONTINUED] chlorthalidone (HYGROTON) 25 MG tablet Take 1 tablet (25 mg total) by mouth daily.   [DISCONTINUED] linaclotide (LINZESS) 72 MCG capsule Take 1 capsule (72 mcg total) by mouth daily before breakfast. TAKE 1 CAPSULE BY MOUTH EVERY DAY IN THE MORNING   [DISCONTINUED] rosuvastatin (CRESTOR) 5 MG tablet Take 1 tablet (5 mg total)  by mouth at bedtime.   No facility-administered medications prior to visit.    Review of Systems  Constitutional: Negative.   HENT: Negative.    Eyes: Negative.   Respiratory: Negative.    Cardiovascular: Negative.   Genitourinary: Negative.   Musculoskeletal:  Positive for joint pain.  Skin: Negative.   Neurological:  Negative for dizziness.  Endo/Heme/Allergies: Negative.        Objective:   BP 130/84   Pulse 65   Temp 98.4 F (36.9 C)   Ht 5\' 2"  (1.575 m)   Wt 162 lb (73.5 kg)   SpO2 98%   BMI 29.63 kg/m   Vitals:   10/24/23 1005  BP: 130/84  Pulse: 65  Temp: 98.4 F (36.9 C)  Height: 5\' 2"  (1.575 m)  Weight: 162 lb (73.5 kg)  SpO2: 98%  BMI (Calculated): 29.62    Physical Exam Vitals reviewed.  Constitutional:      General: She  is not in acute distress. HENT:     Head: Normocephalic.     Nose: Nose normal.     Mouth/Throat:     Mouth: Mucous membranes are moist.  Eyes:     Extraocular Movements: Extraocular movements intact.     Pupils: Pupils are equal, round, and reactive to light.  Cardiovascular:     Rate and Rhythm: Normal rate and regular rhythm.     Heart sounds: No murmur heard. Pulmonary:     Effort: Pulmonary effort is normal.     Breath sounds: No rhonchi or rales.  Abdominal:     General: Abdomen is flat.     Palpations: There is no hepatomegaly, splenomegaly or mass.  Musculoskeletal:        General: Normal range of motion.     Cervical back: Normal range of motion. No tenderness.  Skin:    General: Skin is warm and dry.  Neurological:     General: No focal deficit present.     Mental Status: She is alert and oriented to person, place, and time.     Cranial Nerves: No cranial nerve deficit.     Motor: No weakness.  Psychiatric:        Mood and Affect: Mood normal.        Behavior: Behavior normal.      No results found for any visits on 10/24/23.  Recent Results (from the past 2160 hours)  CK     Status: Abnormal   Collection Time: 07/26/23 10:23 AM  Result Value Ref Range   Total CK 371 (H) 26 - 161 U/L  Comprehensive metabolic panel     Status: Abnormal   Collection Time: 07/26/23 10:23 AM  Result Value Ref Range   Glucose 91 70 - 99 mg/dL   BUN 20 8 - 27 mg/dL   Creatinine, Ser 1.61 (H) 0.57 - 1.00 mg/dL   eGFR 37 (L) >09 UE/AVW/0.98   BUN/Creatinine Ratio 14 12 - 28   Sodium 141 134 - 144 mmol/L   Potassium 3.8 3.5 - 5.2 mmol/L   Chloride 100 96 - 106 mmol/L   CO2 28 20 - 29 mmol/L   Calcium 9.7 8.7 - 10.3 mg/dL   Total Protein 6.8 6.0 - 8.5 g/dL   Albumin 4.6 3.7 - 4.7 g/dL   Globulin, Total 2.2 1.5 - 4.5 g/dL   Bilirubin Total 0.2 0.0 - 1.2 mg/dL   Alkaline Phosphatase 63 44 - 121 IU/L   AST 20 0 - 40 IU/L  ALT 15 0 - 32 IU/L  Lipid panel     Status: None    Collection Time: 07/26/23 10:23 AM  Result Value Ref Range   Cholesterol, Total 130 100 - 199 mg/dL   Triglycerides 97 0 - 149 mg/dL   HDL 44 >16 mg/dL   VLDL Cholesterol Cal 18 5 - 40 mg/dL   LDL Chol Calc (NIH) 68 0 - 99 mg/dL   Chol/HDL Ratio 3.0 0.0 - 4.4 ratio    Comment:                                   T. Chol/HDL Ratio                                             Men  Women                               1/2 Avg.Risk  3.4    3.3                                   Avg.Risk  5.0    4.4                                2X Avg.Risk  9.6    7.1                                3X Avg.Risk 23.4   11.0   POCT Urinalysis Dipstick (10960)     Status: Abnormal   Collection Time: 07/26/23 10:53 AM  Result Value Ref Range   Color, UA yellow    Clarity, UA clear    Glucose, UA Negative Negative   Bilirubin, UA Negative    Ketones, UA Negative    Spec Grav, UA 1.010 1.010 - 1.025   Blood, UA Negative    pH, UA 6.5 5.0 - 8.0   Protein, UA Negative Negative   Urobilinogen, UA negative (A) 0.2 or 1.0 E.U./dL   Nitrite, UA Negative    Leukocytes, UA Negative Negative   Appearance Clear    Odor NO   POC CREATINE & ALBUMIN,URINE     Status: None   Collection Time: 07/26/23 10:53 AM  Result Value Ref Range   Microalbumin Ur, POC 10 mg/L   Creatinine, POC 50 mg/dL   Albumin/Creatinine Ratio, Urine, POC <30       Assessment & Plan:   Problem List Items Addressed This Visit       Respiratory   Seasonal allergic rhinitis due to pollen   Relevant Medications   cetirizine (ZYRTEC ALLERGY) 10 MG tablet   diclofenac Sodium (VOLTAREN) 1 % GEL     Digestive   Chronic idiopathic constipation   Relevant Medications   linaclotide (LINZESS) 72 MCG capsule     Other   Mixed hyperlipidemia   Relevant Medications   losartan (COZAAR) 25 MG tablet   rosuvastatin (CRESTOR) 5 MG tablet   Other Relevant Orders   CK   Lipid panel   Other Visit Diagnoses  Sprain of right shoulder,  unspecified shoulder sprain type, initial encounter    -  Primary   Relevant Medications   diclofenac Sodium (VOLTAREN) 1 % GEL     Menopause       Relevant Orders   DG Bone Density       Return in about 3 months (around 01/24/2024) for awv with labs prior.   Total time spent: 30 minutes  Luna Fuse, MD  10/24/2023   This document may have been prepared by [facility] Voice Recognition software and as such may include unintentional dictation errors.

## 2023-11-08 DIAGNOSIS — I129 Hypertensive chronic kidney disease with stage 1 through stage 4 chronic kidney disease, or unspecified chronic kidney disease: Secondary | ICD-10-CM | POA: Diagnosis not present

## 2023-11-08 DIAGNOSIS — N1832 Chronic kidney disease, stage 3b: Secondary | ICD-10-CM | POA: Diagnosis not present

## 2023-11-08 DIAGNOSIS — E785 Hyperlipidemia, unspecified: Secondary | ICD-10-CM | POA: Diagnosis not present

## 2024-01-08 ENCOUNTER — Telehealth: Payer: Self-pay | Admitting: Internal Medicine

## 2024-01-08 NOTE — Telephone Encounter (Signed)
 Patients husband came in stating that she needed a refill on her losartan, to [facility] in [place]. Please adv

## 2024-01-09 ENCOUNTER — Other Ambulatory Visit: Payer: Self-pay

## 2024-01-09 DIAGNOSIS — I10 Essential (primary) hypertension: Secondary | ICD-10-CM

## 2024-01-09 NOTE — Telephone Encounter (Signed)
 Pt's son called requesting refill please advise

## 2024-01-11 ENCOUNTER — Other Ambulatory Visit: Payer: Self-pay

## 2024-01-28 ENCOUNTER — Other Ambulatory Visit: Payer: Self-pay | Admitting: Internal Medicine

## 2024-01-28 DIAGNOSIS — E782 Mixed hyperlipidemia: Secondary | ICD-10-CM

## 2024-01-31 ENCOUNTER — Other Ambulatory Visit: Payer: Self-pay | Admitting: Internal Medicine

## 2024-01-31 DIAGNOSIS — E782 Mixed hyperlipidemia: Secondary | ICD-10-CM

## 2024-02-02 ENCOUNTER — Other Ambulatory Visit

## 2024-02-02 ENCOUNTER — Other Ambulatory Visit: Payer: Self-pay | Admitting: Internal Medicine

## 2024-02-02 DIAGNOSIS — E782 Mixed hyperlipidemia: Secondary | ICD-10-CM

## 2024-02-02 DIAGNOSIS — I10 Essential (primary) hypertension: Secondary | ICD-10-CM | POA: Diagnosis not present

## 2024-02-02 DIAGNOSIS — R7303 Prediabetes: Secondary | ICD-10-CM

## 2024-02-02 DIAGNOSIS — E785 Hyperlipidemia, unspecified: Secondary | ICD-10-CM | POA: Diagnosis not present

## 2024-02-03 LAB — COMPREHENSIVE METABOLIC PANEL WITH GFR
ALT: 16 IU/L (ref 0–32)
AST: 20 IU/L (ref 0–40)
Albumin: 4.3 g/dL (ref 3.7–4.7)
Alkaline Phosphatase: 64 IU/L (ref 44–121)
BUN/Creatinine Ratio: 17 (ref 12–28)
BUN: 20 mg/dL (ref 8–27)
Bilirubin Total: <0.2 mg/dL (ref 0.0–1.2)
CO2: 22 mmol/L (ref 20–29)
Calcium: 9.9 mg/dL (ref 8.7–10.3)
Chloride: 102 mmol/L (ref 96–106)
Creatinine, Ser: 1.15 mg/dL — ABNORMAL HIGH (ref 0.57–1.00)
Globulin, Total: 2.2 g/dL (ref 1.5–4.5)
Glucose: 86 mg/dL (ref 70–99)
Potassium: 4.1 mmol/L (ref 3.5–5.2)
Sodium: 140 mmol/L (ref 134–144)
Total Protein: 6.5 g/dL (ref 6.0–8.5)
eGFR: 48 mL/min/{1.73_m2} — ABNORMAL LOW (ref >59)

## 2024-02-03 LAB — LIPID PANEL
Chol/HDL Ratio: 2.6 ratio (ref 0.0–4.4)
Cholesterol, Total: 129 mg/dL (ref 100–199)
HDL: 49 mg/dL (ref >39)
LDL Chol Calc (NIH): 62 mg/dL (ref 0–99)
Triglycerides: 93 mg/dL (ref 0–149)
VLDL Cholesterol Cal: 18 mg/dL (ref 5–40)

## 2024-02-03 LAB — CBC WITH DIFFERENTIAL/PLATELET
Basophils Absolute: 0.1 10*3/uL (ref 0.0–0.2)
Basos: 1 %
EOS (ABSOLUTE): 0.2 10*3/uL (ref 0.0–0.4)
Eos: 2 %
Hematocrit: 37.1 % (ref 34.0–46.6)
Hemoglobin: 12.1 g/dL (ref 11.1–15.9)
Immature Grans (Abs): 0 10*3/uL (ref 0.0–0.1)
Immature Granulocytes: 0 %
Lymphocytes Absolute: 3.5 10*3/uL — ABNORMAL HIGH (ref 0.7–3.1)
Lymphs: 40 %
MCH: 30.8 pg (ref 26.6–33.0)
MCHC: 32.6 g/dL (ref 31.5–35.7)
MCV: 94 fL (ref 79–97)
Monocytes Absolute: 0.6 10*3/uL (ref 0.1–0.9)
Monocytes: 7 %
Neutrophils Absolute: 4.3 10*3/uL (ref 1.4–7.0)
Neutrophils: 50 %
Platelets: 332 10*3/uL (ref 150–450)
RBC: 3.93 x10E6/uL (ref 3.77–5.28)
RDW: 12.3 % (ref 11.7–15.4)
WBC: 8.7 10*3/uL (ref 3.4–10.8)

## 2024-02-03 LAB — HGB A1C W/O EAG: Hgb A1c MFr Bld: 5.4 % (ref 4.8–5.6)

## 2024-02-03 LAB — CK: Total CK: 72 U/L (ref 26–161)

## 2024-02-05 ENCOUNTER — Encounter: Payer: Self-pay | Admitting: Internal Medicine

## 2024-02-05 ENCOUNTER — Ambulatory Visit (INDEPENDENT_AMBULATORY_CARE_PROVIDER_SITE_OTHER): Admitting: Internal Medicine

## 2024-02-05 ENCOUNTER — Ambulatory Visit: Payer: Self-pay | Admitting: Internal Medicine

## 2024-02-05 DIAGNOSIS — Z1331 Encounter for screening for depression: Secondary | ICD-10-CM | POA: Diagnosis not present

## 2024-02-05 DIAGNOSIS — I10 Essential (primary) hypertension: Secondary | ICD-10-CM

## 2024-02-05 DIAGNOSIS — N951 Menopausal and female climacteric states: Secondary | ICD-10-CM

## 2024-02-05 DIAGNOSIS — Z0001 Encounter for general adult medical examination with abnormal findings: Secondary | ICD-10-CM

## 2024-02-05 DIAGNOSIS — K5904 Chronic idiopathic constipation: Secondary | ICD-10-CM | POA: Diagnosis not present

## 2024-02-05 DIAGNOSIS — F3289 Other specified depressive episodes: Secondary | ICD-10-CM

## 2024-02-05 DIAGNOSIS — J301 Allergic rhinitis due to pollen: Secondary | ICD-10-CM

## 2024-02-05 MED ORDER — LINACLOTIDE 72 MCG PO CAPS: 72.0000 ug | ORAL_CAPSULE | Freq: Every day | ORAL | 0 refills | Status: DC

## 2024-02-05 MED ORDER — MIRTAZAPINE 15 MG PO TABS: 15.0000 mg | ORAL_TABLET | Freq: Every day | ORAL | 1 refills | Status: DC

## 2024-02-05 MED ORDER — CLONIDINE HCL 0.1 MG PO TABS: 0.1000 mg | ORAL_TABLET | Freq: Every evening | ORAL | 1 refills | Status: DC

## 2024-02-05 MED ORDER — AMLODIPINE BESY-BENAZEPRIL HCL 10-20 MG PO CAPS: 1.0000 | ORAL_CAPSULE | Freq: Every day | ORAL | 1 refills | Status: DC

## 2024-02-05 MED ORDER — AZELASTINE HCL 137 MCG/SPRAY NA SOLN
1.0000 | Freq: Every day | NASAL | 2 refills | Status: DC
Start: 2024-02-05 — End: 2024-05-29

## 2024-02-05 MED ORDER — CETIRIZINE HCL 10 MG PO TABS
10.0000 mg | ORAL_TABLET | Freq: Every day | ORAL | 2 refills | Status: DC
Start: 2024-02-05 — End: 2024-05-29

## 2024-02-05 NOTE — Progress Notes (Signed)
 Established Patient Office Visit  Subjective:  Patient ID: Deborah Hurst, female    DOB: 15-Jun-1941  Age: 83 y.o. MRN: [id number]  Chief Complaint  Patient presents with   Annual Exam    AWE & 3 Months Lab Results    No new complaints, here for AWV refer to quality metrics and scanned documents. Labs reviewed and notable for well controlled lipids, unremarkable cbc, cmp notable for improved gfr and A1c now normal.      No other concerns at this time.   Past Medical History:  Diagnosis Date   GERD (gastroesophageal reflux disease)    Hyperlipidemia    Hypertension     Past Surgical History:  Procedure Laterality Date   ABDOMINAL HYSTERECTOMY     CHOLECYSTECTOMY     KNEE SURGERY Right     Social History   Socioeconomic History   Marital status: Married    Spouse name: Not on file   Number of children: Not on file   Years of education: Not on file   Highest education level: Not on file  Occupational History   Not on file  Tobacco Use   Smoking status: Never   Smokeless tobacco: Never  Vaping Use   Vaping status: Never Used  Substance and Sexual Activity   Alcohol use: Never   Drug use: Never   Sexual activity: Not on file  Other Topics Concern   Not on file  Social History Narrative   Not on file   Social Drivers of Health   Financial Resource Strain: Not on file  Food Insecurity: No Food Insecurity (02/02/2023)   Hunger Vital Sign    Worried About Running Out of Food in the Last Year: Never true    Ran Out of Food in the Last Year: Never true  Transportation Needs: No Transportation Needs (02/02/2023)   PRAPARE - Administrator, Civil Service (Medical): No    Lack of Transportation (Non-Medical): No  Physical Activity: Not on file  Stress: Not on file  Social Connections: Not on file  Intimate Partner Violence: Not At Risk (02/02/2023)   Humiliation, Afraid, Rape, and Kick questionnaire    Fear of Current or Ex-Partner: No     Emotionally Abused: No    Physically Abused: No    Sexually Abused: No    Family History  Problem Relation Age of Onset   Heart attack Mother 90   Prostate cancer Father    Breast cancer Neg Hx     No Known Allergies  Outpatient Medications Prior to Visit  Medication Sig   fluticasone (FLONASE) 50 MCG/ACT nasal spray    losartan (COZAAR) 25 MG tablet Take 25 mg by mouth daily.   rosuvastatin  (CRESTOR ) 5 MG tablet TAKE 1 TABLET(5 MG) BY MOUTH AT BEDTIME   [DISCONTINUED] amLODipine -benazepril  (LOTREL) 10-20 MG capsule Take 1 capsule by mouth daily.   [DISCONTINUED] Azelastine  HCl 137 MCG/SPRAY SOLN Place 1 spray into the nose daily.   [DISCONTINUED] cetirizine  (ZYRTEC  ALLERGY) 10 MG tablet Take 1 tablet (10 mg total) by mouth daily.   [DISCONTINUED] cloNIDine  (CATAPRES ) 0.1 MG tablet Take 1 tablet (0.1 mg total) by mouth at bedtime.   [DISCONTINUED] linaclotide  (LINZESS ) 72 MCG capsule Take 1 capsule (72 mcg total) by mouth daily before breakfast. TAKE 1 CAPSULE BY MOUTH EVERY DAY IN THE MORNING   [DISCONTINUED] mirtazapine  (REMERON ) 15 MG tablet Take 1 tablet (15 mg total) by mouth at bedtime.   No facility-administered medications prior  to visit.    Review of Systems  Constitutional: Negative.  Negative for weight loss (gained 3 lbs).  HENT: Negative.    Eyes: Negative.   Respiratory: Negative.    Cardiovascular: Negative.   Genitourinary: Negative.   Musculoskeletal:  Positive for joint pain.  Skin: Negative.   Neurological:  Negative for dizziness.  Endo/Heme/Allergies: Negative.        Objective:   BP (!) 140/80 (Cuff Size: Normal)   Pulse 82   Temp 98.1 F (36.7 C) (Tympanic)   Ht 5' 2 (1.575 m)   Wt 165 lb (74.8 kg)   SpO2 97%   BMI 30.18 kg/m   Vitals:   02/05/24 0954 02/05/24 1024  BP: (!) 142/74 (!) 140/80  Pulse: 82   Temp: 98.1 F (36.7 C)   Height: 5' 2 (1.575 m)   Weight: 165 lb (74.8 kg)   SpO2: 97%   TempSrc: Tympanic   BMI  (Calculated): 30.17     Physical Exam Vitals reviewed.  Constitutional:      General: She is not in acute distress. HENT:     Head: Normocephalic.     Nose: Nose normal.     Mouth/Throat:     Mouth: Mucous membranes are moist.   Eyes:     Extraocular Movements: Extraocular movements intact.     Pupils: Pupils are equal, round, and reactive to light.    Cardiovascular:     Rate and Rhythm: Normal rate and regular rhythm.     Heart sounds: No murmur heard. Pulmonary:     Effort: Pulmonary effort is normal.     Breath sounds: No rhonchi or rales.  Abdominal:     General: Abdomen is flat.     Palpations: There is no hepatomegaly, splenomegaly or mass.   Musculoskeletal:        General: Normal range of motion.     Cervical back: Normal range of motion. No tenderness.   Skin:    General: Skin is warm and dry.   Neurological:     General: No focal deficit present.     Mental Status: She is alert and oriented to person, place, and time.     Cranial Nerves: No cranial nerve deficit.     Motor: No weakness.   Psychiatric:        Mood and Affect: Mood normal.        Behavior: Behavior normal.      No results found for any visits on 02/05/24.  Recent Results (from the past 2160 hours)  CBC with Differential/Platelet     Status: Abnormal   Collection Time: 02/02/24  9:09 AM  Result Value Ref Range   WBC 8.7 3.4 - 10.8 x10E3/uL   RBC 3.93 3.77 - 5.28 x10E6/uL   Hemoglobin 12.1 11.1 - 15.9 g/dL   Hematocrit 62.8 65.9 - 46.6 %   MCV 94 79 - 97 fL   MCH 30.8 26.6 - 33.0 pg   MCHC 32.6 31.5 - 35.7 g/dL   RDW 87.6 88.2 - 84.5 %   Platelets 332 150 - 450 x10E3/uL   Neutrophils 50 Not Estab. %   Lymphs 40 Not Estab. %   Monocytes 7 Not Estab. %   Eos 2 Not Estab. %   Basos 1 Not Estab. %   Neutrophils Absolute 4.3 1.4 - 7.0 x10E3/uL   Lymphocytes Absolute 3.5 (H) 0.7 - 3.1 x10E3/uL   Monocytes Absolute 0.6 0.1 - 0.9 x10E3/uL   EOS (ABSOLUTE) 0.2  0.0 - 0.4 x10E3/uL    Basophils Absolute 0.1 0.0 - 0.2 x10E3/uL   Immature Granulocytes 0 Not Estab. %   Immature Grans (Abs) 0.0 0.0 - 0.1 x10E3/uL  Comprehensive metabolic panel with GFR     Status: Abnormal   Collection Time: 02/02/24  9:09 AM  Result Value Ref Range   Glucose 86 70 - 99 mg/dL   BUN 20 8 - 27 mg/dL   Creatinine, Ser 8.84 (H) 0.57 - 1.00 mg/dL   eGFR 48 (L) >40 fO/fpw/8.26   BUN/Creatinine Ratio 17 12 - 28   Sodium 140 134 - 144 mmol/L   Potassium 4.1 3.5 - 5.2 mmol/L   Chloride 102 96 - 106 mmol/L   CO2 22 20 - 29 mmol/L   Calcium  9.9 8.7 - 10.3 mg/dL   Total Protein 6.5 6.0 - 8.5 g/dL   Albumin 4.3 3.7 - 4.7 g/dL   Globulin, Total 2.2 1.5 - 4.5 g/dL   Bilirubin Total <9.7 0.0 - 1.2 mg/dL   Alkaline Phosphatase 64 44 - 121 IU/L   AST 20 0 - 40 IU/L   ALT 16 0 - 32 IU/L  Lipid panel     Status: None   Collection Time: 02/02/24  9:09 AM  Result Value Ref Range   Cholesterol, Total 129 100 - 199 mg/dL   Triglycerides 93 0 - 149 mg/dL   HDL 49 >60 mg/dL   VLDL Cholesterol Cal 18 5 - 40 mg/dL   LDL Chol Calc (NIH) 62 0 - 99 mg/dL   Chol/HDL Ratio 2.6 0.0 - 4.4 ratio    Comment:                                   T. Chol/HDL Ratio                                             Men  Women                               1/2 Avg.Risk  3.4    3.3                                   Avg.Risk  5.0    4.4                                2X Avg.Risk  9.6    7.1                                3X Avg.Risk 23.4   11.0   Hgb A1c w/o eAG     Status: None   Collection Time: 02/02/24  9:09 AM  Result Value Ref Range   Hgb A1c MFr Bld 5.4 4.8 - 5.6 %    Comment:          Prediabetes: 5.7 - 6.4          Diabetes: >6.4          Glycemic control for adults with diabetes: <7.0   CK  Status: None   Collection Time: 02/02/24  9:09 AM  Result Value Ref Range   Total CK 72 26 - 161 U/L      Assessment & Plan:  As per problem list  Problem List Items Addressed This Visit       Cardiovascular and  Mediastinum   Primary hypertension   Relevant Medications   amLODipine -benazepril  (LOTREL) 10-20 MG capsule   cloNIDine  (CATAPRES ) 0.1 MG tablet   Hot flashes due to menopause   Relevant Medications   amLODipine -benazepril  (LOTREL) 10-20 MG capsule   cloNIDine  (CATAPRES ) 0.1 MG tablet     Respiratory   Seasonal allergic rhinitis due to pollen   Relevant Medications   Azelastine  HCl 137 MCG/SPRAY SOLN   cetirizine  (ZYRTEC  ALLERGY) 10 MG tablet     Digestive   Chronic idiopathic constipation   Relevant Medications   linaclotide  (LINZESS ) 72 MCG capsule     Other   Depression   Relevant Medications   mirtazapine  (REMERON ) 15 MG tablet    Return in 3 weeks (on 02/26/2024) for memory eval.   Total time spent: 40 minutes  Sherrill Cinderella Perry, MD  02/05/2024   This document may have been prepared by [employer] Voice Recognition software and as such may include unintentional dictation errors.

## 2024-03-04 NOTE — Progress Notes (Signed)
   03/04/2024  Patient ID: Deborah Hurst, female   DOB: 07-24-1941, 83 y.o.   MRN: [id number]  Pharmacy Quality Measure Review  This patient is appearing on a report for being at risk of failing the adherence measure for cholesterol (statin) medications this calendar year.   Medication: Rosuvastatin  5mg  Last fill date: 01/30/24 for 90 day supply  Insurance report was not up to date. No action needed at this time.   [name], PharmD Clinical Pharmacist [phone]

## 2024-04-09 ENCOUNTER — Ambulatory Visit
Admission: EM | Admit: 2024-04-09 | Discharge: 2024-04-09 | Disposition: A | Discharge status: Home / Self Care | Attending: Physician Assistant | Admitting: Physician Assistant

## 2024-04-09 ENCOUNTER — Encounter: Payer: Self-pay | Admitting: Emergency Medicine

## 2024-04-09 DIAGNOSIS — R0981 Nasal congestion: Secondary | ICD-10-CM | POA: Diagnosis not present

## 2024-04-09 DIAGNOSIS — J019 Acute sinusitis, unspecified: Secondary | ICD-10-CM

## 2024-04-09 DIAGNOSIS — R051 Acute cough: Secondary | ICD-10-CM | POA: Diagnosis not present

## 2024-04-09 MED ORDER — AMOXICILLIN-POT CLAVULANATE 875-125 MG PO TABS: 1.0000 | ORAL_TABLET | Freq: Two times a day (BID) | ORAL | 0 refills | Status: AC

## 2024-04-09 MED ORDER — PROMETHAZINE-DM 6.25-15 MG/5ML PO SYRP: 5.0000 mL | ORAL_SOLUTION | Freq: Four times a day (QID) | ORAL | 0 refills | Status: DC | PRN

## 2024-04-09 NOTE — Discharge Instructions (Signed)
-  Return if fever, chest pain or increased breathing issues

## 2024-04-09 NOTE — ED Provider Notes (Signed)
 [facility]    CSN: [id number] Arrival date & time: 04/09/24  1048      History   Chief Complaint Chief Complaint  Patient presents with   Cough   Nasal Congestion    HPI Deborah ECKERSLEY is a 83 y.o. female presenting for fatigue, nasal congestion, sinus pressure and productive cough x 2 weeks. No fever. Mild SOB with coughing fits.  She denies fever, sore throat, chest pain or wheezing.  Has been taking over-the-counter cough medication and drinking teas but states it has not helped.  She reports a lot of postnasal drainage and feels like the excessive mucus is upsetting her stomach.  HPI  Past Medical History:  Diagnosis Date   GERD (gastroesophageal reflux disease)    Hyperlipidemia    Hypertension     Patient Active Problem List   Diagnosis Date Noted   CKD (chronic kidney disease) stage 4, GFR 15-29 ml/min (HCC) 07/18/2023   Seasonal allergic rhinitis due to pollen 04/18/2023   Acute kidney injury (HCC) 04/18/2023   Prediabetes 03/28/2023   Hypotension 02/03/2023   Near syncope 02/02/2023   Depression 12/27/2022   Hot flashes due to menopause 12/27/2022   Primary hypertension 09/20/2022   Chronic idiopathic constipation 09/20/2022   Mixed hyperlipidemia 09/20/2022    Past Surgical History:  Procedure Laterality Date   ABDOMINAL HYSTERECTOMY     CHOLECYSTECTOMY     KNEE SURGERY Right     OB History   No obstetric history on file.      Home Medications    Prior to Admission medications   Medication Sig Start Date End Date Taking? Authorizing Provider  amoxicillin -clavulanate (AUGMENTIN ) 875-125 MG tablet Take 1 tablet by mouth every 12 (twelve) hours for 7 days. 04/09/24 04/16/24 Yes Arvis Huxley B, PA-C  promethazine -dextromethorphan (PROMETHAZINE -DM) 6.25-15 MG/5ML syrup Take 5 mLs by mouth 4 (four) times daily as needed. 04/09/24  Yes Arvis Huxley B, PA-C  amLODipine -benazepril  (LOTREL) 10-20 MG capsule Take 1 capsule by mouth daily.  02/05/24 08/03/24  Albina GORMAN Dine, MD  Azelastine  HCl 137 MCG/SPRAY SOLN Place 1 spray into the nose daily. 02/05/24 05/05/24  Albina GORMAN Dine, MD  cetirizine  (ZYRTEC  ALLERGY) 10 MG tablet Take 1 tablet (10 mg total) by mouth daily. 02/05/24 05/05/24  Albina GORMAN Dine, MD  cloNIDine  (CATAPRES ) 0.1 MG tablet Take 1 tablet (0.1 mg total) by mouth at bedtime. 02/05/24 08/03/24  Albina GORMAN Dine, MD  fluticasone (FLONASE) 50 MCG/ACT nasal spray     [provider]  linaclotide  (LINZESS ) 72 MCG capsule Take 1 capsule (72 mcg total) by mouth daily before breakfast. TAKE 1 CAPSULE BY MOUTH EVERY DAY IN THE MORNING 02/05/24 05/05/24  Albina GORMAN Dine, MD  losartan (COZAAR) 25 MG tablet Take 25 mg by mouth daily. 10/11/23 10/10/24  [provider]  mirtazapine  (REMERON ) 15 MG tablet Take 1 tablet (15 mg total) by mouth at bedtime. 02/05/24 08/03/24  Albina GORMAN Dine, MD  rosuvastatin  (CRESTOR ) 5 MG tablet TAKE 1 TABLET(5 MG) BY MOUTH AT BEDTIME 01/30/24   Albina GORMAN Dine, MD    Family History Family History  Problem Relation Age of Onset   Heart attack Mother 73   Prostate cancer Father    Breast cancer Neg Hx     Social History Social History   Tobacco Use   Smoking status: Never   Smokeless tobacco: Never  Vaping Use   Vaping status: Never Used  Substance Use Topics   Alcohol use: Never  Drug use: Never     Allergies   Patient has no known allergies.   Review of Systems Review of Systems  Constitutional:  Positive for fatigue. Negative for chills, diaphoresis and fever.  HENT:  Positive for congestion, rhinorrhea and sinus pressure. Negative for ear pain, sinus pain and sore throat.   Respiratory:  Positive for cough and shortness of breath.   Gastrointestinal:  Negative for abdominal pain, nausea and vomiting.  Musculoskeletal:  Negative for arthralgias and myalgias.  Skin:  Negative for rash.  Neurological:  Negative for weakness and headaches.   Hematological:  Negative for adenopathy.  Psychiatric/Behavioral:  Positive for sleep disturbance (due to cough).      Physical Exam Triage Vital Signs ED Triage Vitals  Encounter Vitals Group     BP      Girls Systolic BP Percentile      Girls Diastolic BP Percentile      Boys Systolic BP Percentile      Boys Diastolic BP Percentile      Pulse      Resp      Temp      Temp src      SpO2      Weight      Height      Head Circumference      Peak Flow      Pain Score      Pain Loc      Pain Education      Exclude from Growth Chart    No data found.  Updated Vital Signs BP (!) 148/82 (BP Location: Right Arm)   Pulse 65   Temp 98 F (36.7 C) (Oral)   Resp 16   Wt 160 lb (72.6 kg)   SpO2 97%   BMI 29.26 kg/m      Physical Exam Vitals and nursing note reviewed.  Constitutional:      General: She is not in acute distress.    Appearance: Normal appearance. She is not ill-appearing or toxic-appearing.  HENT:     Head: Normocephalic and atraumatic.     Nose: Congestion present.     Mouth/Throat:     Mouth: Mucous membranes are moist.     Pharynx: Oropharynx is clear.  Eyes:     General: No scleral icterus.       Right eye: No discharge.        Left eye: No discharge.     Conjunctiva/sclera: Conjunctivae normal.  Cardiovascular:     Rate and Rhythm: Normal rate and regular rhythm.     Heart sounds: Normal heart sounds.  Pulmonary:     Effort: Pulmonary effort is normal. No respiratory distress.     Breath sounds: Normal breath sounds.  Musculoskeletal:     Cervical back: Neck supple.  Skin:    General: Skin is dry.  Neurological:     General: No focal deficit present.     Mental Status: She is alert. Mental status is at baseline.     Motor: No weakness.     Gait: Gait normal.  Psychiatric:        Mood and Affect: Mood normal.        Behavior: Behavior normal.      UC Treatments / Results  Labs (all labs ordered are listed, but only abnormal  results are displayed) Labs Reviewed - No data to display  EKG   Radiology No results found.  Procedures Procedures (including critical care time)  Medications Ordered  in UC Medications - No data to display  Initial Impression / Assessment and Plan / UC Course  I have reviewed the triage vital signs and the nursing notes.  Pertinent labs & imaging results that were available during my care of the patient were reviewed by me and considered in my medical decision making (see chart for details).   83 year old female presents for cough, congestion, fatigue and mild shortness of breath with coughing fits for the past 2 weeks.  Also reports some sinus pressure but denies pain.  No fever or chest pain.  Vitals are stable and normal.  Patient overall well-appearing.  Has nasal congestion.  Throat is without erythema but is significant for postnasal drainage.  Chest is clear.  Heart regular rate and rhythm.  Acute sinusitis.  Sent Augmentin  and Promethazine  DM to pharmacy.  Encouraged plenty of rest and fluids.  Advised her to return if fever, chest pain or increased shortness of breath or if she is not improving over the next week.   Final Clinical Impressions(s) / UC Diagnoses   Final diagnoses:  Acute sinusitis, recurrence not specified, unspecified location  Acute cough  Nasal congestion     Discharge Instructions      -Return if fever, chest pain or increased breathing issues     ED Prescriptions     Medication Sig Dispense Auth. Provider   amoxicillin -clavulanate (AUGMENTIN ) 875-125 MG tablet Take 1 tablet by mouth every 12 (twelve) hours for 7 days. 14 tablet Arvis Huxley B, PA-C   promethazine -dextromethorphan (PROMETHAZINE -DM) 6.25-15 MG/5ML syrup Take 5 mLs by mouth 4 (four) times daily as needed. 118 mL Arvis Huxley NOVAK, PA-C      PDMP not reviewed this encounter.   Arvis Huxley NOVAK, PA-C 04/09/24 1317

## 2024-04-09 NOTE — ED Triage Notes (Signed)
 Pr presents with a cough and excessive mucus x 2 weeks. Pt feels like she has a lot of mucus in her stomach. Her cough is worse at night. She has taken OTC cold medication and teas with no relief.

## 2024-04-26 ENCOUNTER — Other Ambulatory Visit: Payer: Self-pay | Admitting: Internal Medicine

## 2024-04-26 DIAGNOSIS — E782 Mixed hyperlipidemia: Secondary | ICD-10-CM

## 2024-05-06 ENCOUNTER — Other Ambulatory Visit

## 2024-05-06 DIAGNOSIS — E782 Mixed hyperlipidemia: Secondary | ICD-10-CM

## 2024-05-06 DIAGNOSIS — I10 Essential (primary) hypertension: Secondary | ICD-10-CM | POA: Diagnosis not present

## 2024-05-06 DIAGNOSIS — R7303 Prediabetes: Secondary | ICD-10-CM

## 2024-05-07 ENCOUNTER — Encounter: Payer: Self-pay | Admitting: Internal Medicine

## 2024-05-07 ENCOUNTER — Ambulatory Visit: Payer: Self-pay | Admitting: Internal Medicine

## 2024-05-07 ENCOUNTER — Other Ambulatory Visit: Payer: Self-pay | Admitting: Internal Medicine

## 2024-05-07 ENCOUNTER — Ambulatory Visit (INDEPENDENT_AMBULATORY_CARE_PROVIDER_SITE_OTHER): Admitting: Internal Medicine

## 2024-05-07 VITALS — BP 122/80 | HR 71 | Temp 97.8°F | Ht 62.0 in | Wt 163.4 lb

## 2024-05-07 DIAGNOSIS — R7303 Prediabetes: Secondary | ICD-10-CM | POA: Diagnosis not present

## 2024-05-07 DIAGNOSIS — F03A0 Unspecified dementia, mild, without behavioral disturbance, psychotic disturbance, mood disturbance, and anxiety: Secondary | ICD-10-CM

## 2024-05-07 DIAGNOSIS — Z0130 Encounter for examination of blood pressure without abnormal findings: Secondary | ICD-10-CM

## 2024-05-07 LAB — COMPREHENSIVE METABOLIC PANEL WITH GFR
ALT: 33 IU/L — ABNORMAL HIGH (ref 0–32)
AST: 27 IU/L (ref 0–40)
Albumin: 4.1 g/dL (ref 3.7–4.7)
Alkaline Phosphatase: 73 IU/L (ref 48–129)
BUN/Creatinine Ratio: 13 (ref 12–28)
BUN: 15 mg/dL (ref 8–27)
Bilirubin Total: <0.2 mg/dL (ref 0.0–1.2)
CO2: 22 mmol/L (ref 20–29)
Calcium: 9.5 mg/dL (ref 8.7–10.3)
Chloride: 106 mmol/L (ref 96–106)
Creatinine, Ser: 1.12 mg/dL — ABNORMAL HIGH (ref 0.57–1.00)
Globulin, Total: 2.1 g/dL (ref 1.5–4.5)
Glucose: 89 mg/dL (ref 70–99)
Potassium: 4.3 mmol/L (ref 3.5–5.2)
Sodium: 143 mmol/L (ref 134–144)
Total Protein: 6.2 g/dL (ref 6.0–8.5)
eGFR: 49 mL/min/1.73 — ABNORMAL LOW (ref >59)

## 2024-05-07 LAB — CBC WITH DIFF/PLATELET
Basophils Absolute: 0.1 x10E3/uL (ref 0.0–0.2)
Basos: 1 %
EOS (ABSOLUTE): 0.6 x10E3/uL — ABNORMAL HIGH (ref 0.0–0.4)
Eos: 8 %
Hematocrit: 35.8 % (ref 34.0–46.6)
Hemoglobin: 11.3 g/dL (ref 11.1–15.9)
Immature Grans (Abs): 0 x10E3/uL (ref 0.0–0.1)
Immature Granulocytes: 0 %
Lymphocytes Absolute: 3.1 x10E3/uL (ref 0.7–3.1)
Lymphs: 39 %
MCH: 29.9 pg (ref 26.6–33.0)
MCHC: 31.6 g/dL (ref 31.5–35.7)
MCV: 95 fL (ref 79–97)
Monocytes Absolute: 0.6 x10E3/uL (ref 0.1–0.9)
Monocytes: 8 %
Neutrophils Absolute: 3.5 x10E3/uL (ref 1.4–7.0)
Neutrophils: 44 %
Platelets: 323 x10E3/uL (ref 150–450)
RBC: 3.78 x10E6/uL (ref 3.77–5.28)
RDW: 12.5 % (ref 11.7–15.4)
WBC: 8 x10E3/uL (ref 3.4–10.8)

## 2024-05-07 LAB — LIPID PANEL
Chol/HDL Ratio: 2.9 ratio (ref 0.0–4.4)
Cholesterol, Total: 124 mg/dL (ref 100–199)
HDL: 43 mg/dL (ref >39)
LDL Chol Calc (NIH): 62 mg/dL (ref 0–99)
Triglycerides: 100 mg/dL (ref 0–149)
VLDL Cholesterol Cal: 19 mg/dL (ref 5–40)

## 2024-05-07 LAB — HEMOGLOBIN A1C
Est. average glucose Bld gHb Est-mCnc: 114 mg/dL
Hgb A1c MFr Bld: 5.6 % (ref 4.8–5.6)

## 2024-05-07 LAB — POCT CBG (FASTING - GLUCOSE)-MANUAL ENTRY: Glucose Fasting, POC: 102 mg/dL — AB (ref 70–99)

## 2024-05-07 LAB — CK: Total CK: 78 U/L (ref 26–161)

## 2024-05-07 MED ORDER — DONEPEZIL HCL 5 MG PO TABS: 5.0000 mg | ORAL_TABLET | Freq: Every day | ORAL | 0 refills | Status: DC

## 2024-05-07 NOTE — Progress Notes (Signed)
 Established Patient Office Visit  Subjective:  Patient ID: Deborah Hurst, female    DOB: 11-Oct-1940  Age: 83 y.o. MRN: [id number]  Chief Complaint  Patient presents with   Follow-up    3 month memory eval. Pt is complaining of sinus irritation.     No new complaints and here for memory evaluation. No new complaints, here for lab review and medication refills. Labs reviewed and notable for well controlled diabetes, A1c remains at target, lipids at target with unremarkable cmp. Denies any hypoglycemic episodes and home bg readings have been at target.     No other concerns at this time.   Past Medical History:  Diagnosis Date   GERD (gastroesophageal reflux disease)    Hyperlipidemia    Hypertension     Past Surgical History:  Procedure Laterality Date   ABDOMINAL HYSTERECTOMY     CHOLECYSTECTOMY     KNEE SURGERY Right     Social History   Socioeconomic History   Marital status: Married    Spouse name: Not on file   Number of children: Not on file   Years of education: Not on file   Highest education level: Not on file  Occupational History   Not on file  Tobacco Use   Smoking status: Never   Smokeless tobacco: Never  Vaping Use   Vaping status: Never Used  Substance and Sexual Activity   Alcohol use: Never   Drug use: Never   Sexual activity: Not on file  Other Topics Concern   Not on file  Social History Narrative   Not on file   Social Drivers of Health   Financial Resource Strain: Not on file  Food Insecurity: No Food Insecurity (02/02/2023)   Hunger Vital Sign    Worried About Running Out of Food in the Last Year: Never true    Ran Out of Food in the Last Year: Never true  Transportation Needs: No Transportation Needs (02/02/2023)   PRAPARE - Administrator, Civil Service (Medical): No    Lack of Transportation (Non-Medical): No  Physical Activity: Not on file  Stress: Not on file  Social Connections: Not on file  Intimate  Partner Violence: Not At Risk (02/02/2023)   Humiliation, Afraid, Rape, and Kick questionnaire    Fear of Current or Ex-Partner: No    Emotionally Abused: No    Physically Abused: No    Sexually Abused: No    Family History  Problem Relation Age of Onset   Heart attack Mother 7   Prostate cancer Father    Breast cancer Neg Hx     No Known Allergies  Outpatient Medications Prior to Visit  Medication Sig   amLODipine -benazepril  (LOTREL) 10-20 MG capsule Take 1 capsule by mouth daily.   cetirizine  (ZYRTEC  ALLERGY) 10 MG tablet Take 1 tablet (10 mg total) by mouth daily.   diphenhydrAMINE (BENADRYL) 50 MG capsule Take 50 mg by mouth every 6 (six) hours as needed for allergies or sleep.   fluticasone (FLONASE) 50 MCG/ACT nasal spray    mirtazapine  (REMERON ) 15 MG tablet Take 1 tablet (15 mg total) by mouth at bedtime.   rosuvastatin  (CRESTOR ) 5 MG tablet TAKE 1 TABLET(5 MG) BY MOUTH AT BEDTIME   Azelastine  HCl 137 MCG/SPRAY SOLN Place 1 spray into the nose daily.   cloNIDine  (CATAPRES ) 0.1 MG tablet Take 1 tablet (0.1 mg total) by mouth at bedtime. (Patient not taking: Reported on 05/07/2024)   linaclotide  (LINZESS ) 72 MCG  capsule Take 1 capsule (72 mcg total) by mouth daily before breakfast. TAKE 1 CAPSULE BY MOUTH EVERY DAY IN THE MORNING (Patient not taking: Reported on 05/07/2024)   losartan (COZAAR) 25 MG tablet Take 25 mg by mouth daily. (Patient not taking: Reported on 05/07/2024)   promethazine -dextromethorphan (PROMETHAZINE -DM) 6.25-15 MG/5ML syrup Take 5 mLs by mouth 4 (four) times daily as needed. (Patient not taking: Reported on 05/07/2024)   No facility-administered medications prior to visit.    Review of Systems  Constitutional: Negative.  Negative for weight loss (gained 3 lbs).  HENT: Negative.    Eyes: Negative.   Respiratory: Negative.    Cardiovascular: Negative.   Genitourinary: Negative.   Musculoskeletal:  Positive for joint pain.  Skin: Negative.    Neurological:  Negative for dizziness.  Endo/Heme/Allergies: Negative.        Objective:   BP 122/80   Pulse 71   Temp 97.8 F (36.6 C)   Ht 5' 2 (1.575 m)   Wt 163 lb 6.4 oz (74.1 kg)   SpO2 98%   BMI 29.89 kg/m   Vitals:   05/07/24 0923  BP: 122/80  Pulse: 71  Temp: 97.8 F (36.6 C)  Height: 5' 2 (1.575 m)  Weight: 163 lb 6.4 oz (74.1 kg)  SpO2: 98%  BMI (Calculated): 29.88    Physical Exam Vitals reviewed.  Constitutional:      General: She is not in acute distress. HENT:     Head: Normocephalic.     Nose: Nose normal.     Mouth/Throat:     Mouth: Mucous membranes are moist.  Eyes:     Extraocular Movements: Extraocular movements intact.     Pupils: Pupils are equal, round, and reactive to light.  Cardiovascular:     Rate and Rhythm: Normal rate and regular rhythm.     Heart sounds: No murmur heard. Pulmonary:     Effort: Pulmonary effort is normal.     Breath sounds: No rhonchi or rales.  Abdominal:     General: Abdomen is flat.     Palpations: There is no hepatomegaly, splenomegaly or mass.  Musculoskeletal:        General: Normal range of motion.     Cervical back: Normal range of motion. No tenderness.  Skin:    General: Skin is warm and dry.  Neurological:     General: No focal deficit present.     Mental Status: She is alert and oriented to person, place, and time.     Cranial Nerves: No cranial nerve deficit.     Motor: No weakness.  Psychiatric:        Mood and Affect: Mood normal.        Behavior: Behavior normal.      Results for orders placed or performed in visit on 05/07/24  POCT CBG (Fasting - Glucose)  Result Value Ref Range   Glucose Fasting, POC 102 (A) 70 - 99 mg/dL    Recent Results (from the past 2160 hours)  CK     Status: None   Collection Time: 05/06/24  9:58 AM  Result Value Ref Range   Total CK 78 26 - 161 U/L  CBC With Diff/Platelet     Status: Abnormal   Collection Time: 05/06/24  9:58 AM  Result Value  Ref Range   WBC 8.0 3.4 - 10.8 x10E3/uL   RBC 3.78 3.77 - 5.28 x10E6/uL   Hemoglobin 11.3 11.1 - 15.9 g/dL   Hematocrit 64.1 65.9 -  46.6 %   MCV 95 79 - 97 fL   MCH 29.9 26.6 - 33.0 pg   MCHC 31.6 31.5 - 35.7 g/dL   RDW 87.4 88.2 - 84.5 %   Platelets 323 150 - 450 x10E3/uL   Neutrophils 44 Not Estab. %   Lymphs 39 Not Estab. %   Monocytes 8 Not Estab. %   Eos 8 Not Estab. %   Basos 1 Not Estab. %   Neutrophils Absolute 3.5 1.4 - 7.0 x10E3/uL   Lymphocytes Absolute 3.1 0.7 - 3.1 x10E3/uL   Monocytes Absolute 0.6 0.1 - 0.9 x10E3/uL   EOS (ABSOLUTE) 0.6 (H) 0.0 - 0.4 x10E3/uL   Basophils Absolute 0.1 0.0 - 0.2 x10E3/uL   Immature Granulocytes 0 Not Estab. %   Immature Grans (Abs) 0.0 0.0 - 0.1 x10E3/uL  Comprehensive metabolic panel     Status: Abnormal   Collection Time: 05/06/24  9:58 AM  Result Value Ref Range   Glucose 89 70 - 99 mg/dL   BUN 15 8 - 27 mg/dL   Creatinine, Ser 8.87 (H) 0.57 - 1.00 mg/dL   eGFR 49 (L) >40 fO/fpw/8.26   BUN/Creatinine Ratio 13 12 - 28   Sodium 143 134 - 144 mmol/L   Potassium 4.3 3.5 - 5.2 mmol/L   Chloride 106 96 - 106 mmol/L   CO2 22 20 - 29 mmol/L   Calcium  9.5 8.7 - 10.3 mg/dL   Total Protein 6.2 6.0 - 8.5 g/dL   Albumin 4.1 3.7 - 4.7 g/dL   Globulin, Total 2.1 1.5 - 4.5 g/dL   Bilirubin Total <9.7 0.0 - 1.2 mg/dL   Alkaline Phosphatase 73 48 - 129 IU/L    Comment:               **Please note reference interval change**   AST 27 0 - 40 IU/L   ALT 33 (H) 0 - 32 IU/L  Lipid panel     Status: None   Collection Time: 05/06/24  9:58 AM  Result Value Ref Range   Cholesterol, Total 124 100 - 199 mg/dL   Triglycerides 899 0 - 149 mg/dL   HDL 43 >60 mg/dL   VLDL Cholesterol Cal 19 5 - 40 mg/dL   LDL Chol Calc (NIH) 62 0 - 99 mg/dL   Chol/HDL Ratio 2.9 0.0 - 4.4 ratio    Comment:                                   T. Chol/HDL Ratio                                             Men  Women                               1/2 Avg.Risk  3.4     3.3                                   Avg.Risk  5.0    4.4  2X Avg.Risk  9.6    7.1                                3X Avg.Risk 23.4   11.0   Hemoglobin A1c     Status: None   Collection Time: 05/06/24  9:58 AM  Result Value Ref Range   Hgb A1c MFr Bld 5.6 4.8 - 5.6 %    Comment:          Prediabetes: 5.7 - 6.4          Diabetes: >6.4          Glycemic control for adults with diabetes: <7.0    Est. average glucose Bld gHb Est-mCnc 114 mg/dL  POCT CBG (Fasting - Glucose)     Status: Abnormal   Collection Time: 05/07/24  9:33 AM  Result Value Ref Range   Glucose Fasting, POC 102 (A) 70 - 99 mg/dL      Assessment & Plan:  Greenlee was seen today for follow-up.  Prediabetes -     POCT CBG (Fasting - Glucose)  Mild dementia without behavioral disturbance, psychotic disturbance, mood disturbance, or anxiety, unspecified dementia type (HCC) -     CT HEAD WO CONTRAST ( ); Future -     Donepezil  HCl; Take 1 tablet (5 mg total) by mouth at bedtime.  Dispense: 30 tablet; Refill: 0    Problem List Items Addressed This Visit       Other   Prediabetes - Primary   Relevant Orders   POCT CBG (Fasting - Glucose) (Completed)   Other Visit Diagnoses       Mild dementia without behavioral disturbance, psychotic disturbance, mood disturbance, or anxiety, unspecified dementia type (HCC)       Relevant Medications   donepezil  (ARICEPT ) 5 MG tablet   Other Relevant Orders   CT HEAD WO CONTRAST ( )       Return in about 1 month (around 06/06/2024) for memory follow up.   Total time spent: 30 minutes  Sherrill Cinderella Perry, MD    05/07/2024   This document may have been prepared by [facility] Voice Recognition software and as such may include unintentional dictation errors.

## 2024-05-13 DIAGNOSIS — I129 Hypertensive chronic kidney disease with stage 1 through stage 4 chronic kidney disease, or unspecified chronic kidney disease: Secondary | ICD-10-CM | POA: Diagnosis not present

## 2024-05-13 DIAGNOSIS — E785 Hyperlipidemia, unspecified: Secondary | ICD-10-CM | POA: Diagnosis not present

## 2024-05-13 DIAGNOSIS — N1832 Chronic kidney disease, stage 3b: Secondary | ICD-10-CM | POA: Diagnosis not present

## 2024-05-14 ENCOUNTER — Ambulatory Visit
Admission: RE | Admit: 2024-05-14 | Discharge: 2024-05-14 | Disposition: A | Discharge status: Home / Self Care | Source: Ambulatory Visit | Attending: Internal Medicine | Admitting: Internal Medicine

## 2024-05-14 DIAGNOSIS — F03A0 Unspecified dementia, mild, without behavioral disturbance, psychotic disturbance, mood disturbance, and anxiety: Secondary | ICD-10-CM

## 2024-05-14 DIAGNOSIS — N1832 Chronic kidney disease, stage 3b: Secondary | ICD-10-CM | POA: Diagnosis not present

## 2024-05-14 DIAGNOSIS — I129 Hypertensive chronic kidney disease with stage 1 through stage 4 chronic kidney disease, or unspecified chronic kidney disease: Secondary | ICD-10-CM | POA: Diagnosis not present

## 2024-05-14 DIAGNOSIS — E785 Hyperlipidemia, unspecified: Secondary | ICD-10-CM | POA: Diagnosis not present

## 2024-05-29 ENCOUNTER — Ambulatory Visit: Admitting: Internal Medicine

## 2024-05-29 ENCOUNTER — Encounter: Payer: Self-pay | Admitting: Internal Medicine

## 2024-05-29 VITALS — BP 122/78 | HR 84 | Ht 62.0 in | Wt 163.4 lb

## 2024-05-29 DIAGNOSIS — Z0130 Encounter for examination of blood pressure without abnormal findings: Secondary | ICD-10-CM

## 2024-05-29 DIAGNOSIS — K5904 Chronic idiopathic constipation: Secondary | ICD-10-CM

## 2024-05-29 DIAGNOSIS — J301 Allergic rhinitis due to pollen: Secondary | ICD-10-CM

## 2024-05-29 DIAGNOSIS — E782 Mixed hyperlipidemia: Secondary | ICD-10-CM

## 2024-05-29 DIAGNOSIS — F03A0 Unspecified dementia, mild, without behavioral disturbance, psychotic disturbance, mood disturbance, and anxiety: Secondary | ICD-10-CM

## 2024-05-29 DIAGNOSIS — Z131 Encounter for screening for diabetes mellitus: Secondary | ICD-10-CM

## 2024-05-29 MED ORDER — LINACLOTIDE 72 MCG PO CAPS: 72.0000 ug | ORAL_CAPSULE | Freq: Every day | ORAL | 0 refills | Status: AC

## 2024-05-29 MED ORDER — BENZONATATE 100 MG PO CAPS
100.0000 mg | ORAL_CAPSULE | Freq: Three times a day (TID) | ORAL | 0 refills | Status: DC | PRN
Start: 2024-05-29 — End: 2024-06-17

## 2024-05-29 MED ORDER — DONEPEZIL HCL 10 MG PO TABS: 10.0000 mg | ORAL_TABLET | Freq: Every day | ORAL | 2 refills | Status: DC

## 2024-05-29 MED ORDER — CETIRIZINE HCL 10 MG PO TABS: 10.0000 mg | ORAL_TABLET | Freq: Every day | ORAL | 2 refills | Status: AC

## 2024-05-29 MED ORDER — AZELASTINE HCL 137 MCG/SPRAY NA SOLN
1.0000 | Freq: Every day | NASAL | 2 refills | Status: AC
Start: 2024-05-29 — End: 2024-08-27

## 2024-05-29 NOTE — Progress Notes (Signed)
 Established Patient Office Visit  Subjective:  Patient ID: Deborah Hurst, female    DOB: 1941-03-07  Age: 83 y.o. MRN: [id number]  Chief Complaint  Patient presents with   Follow-up    1 month follow up    No side effects with Donepezil . C/o flare of seasonal allergies with postnasal drip, nasal congestion and cough productive of gray sputum. Denies sob or wheezing.    No other concerns at this time.   Past Medical History:  Diagnosis Date   GERD (gastroesophageal reflux disease)    Hyperlipidemia    Hypertension     Past Surgical History:  Procedure Laterality Date   ABDOMINAL HYSTERECTOMY     CHOLECYSTECTOMY     KNEE SURGERY Right     Social History   Socioeconomic History   Marital status: Married    Spouse name: Not on file   Number of children: Not on file   Years of education: Not on file   Highest education level: Not on file  Occupational History   Not on file  Tobacco Use   Smoking status: Never   Smokeless tobacco: Never  Vaping Use   Vaping status: Never Used  Substance and Sexual Activity   Alcohol use: Never   Drug use: Never   Sexual activity: Not on file  Other Topics Concern   Not on file  Social History Narrative   Not on file   Social Drivers of Health   Financial Resource Strain: Not on file  Food Insecurity: No Food Insecurity (02/02/2023)   Hunger Vital Sign    Worried About Running Out of Food in the Last Year: Never true    Ran Out of Food in the Last Year: Never true  Transportation Needs: No Transportation Needs (02/02/2023)   PRAPARE - Administrator, Civil Service (Medical): No    Lack of Transportation (Non-Medical): No  Physical Activity: Not on file  Stress: Not on file  Social Connections: Not on file  Intimate Partner Violence: Not At Risk (02/02/2023)   Humiliation, Afraid, Rape, and Kick questionnaire    Fear of Current or Ex-Partner: No    Emotionally Abused: No    Physically Abused: No     Sexually Abused: No    Family History  Problem Relation Age of Onset   Heart attack Mother 58   Prostate cancer Father    Breast cancer Neg Hx     No Known Allergies  Outpatient Medications Prior to Visit  Medication Sig   amLODipine -benazepril  (LOTREL) 10-20 MG capsule Take 1 capsule by mouth daily.   fluticasone (FLONASE) 50 MCG/ACT nasal spray    rosuvastatin  (CRESTOR ) 5 MG tablet TAKE 1 TABLET(5 MG) BY MOUTH AT BEDTIME   [DISCONTINUED] Azelastine  HCl 137 MCG/SPRAY SOLN Place 1 spray into the nose daily.   [DISCONTINUED] cetirizine  (ZYRTEC  ALLERGY) 10 MG tablet Take 1 tablet (10 mg total) by mouth daily.   cloNIDine  (CATAPRES ) 0.1 MG tablet Take 1 tablet (0.1 mg total) by mouth at bedtime. (Patient not taking: Reported on 05/29/2024)   diphenhydrAMINE (BENADRYL) 50 MG capsule Take 50 mg by mouth every 6 (six) hours as needed for allergies or sleep. (Patient not taking: Reported on 05/29/2024)   linaclotide  (LINZESS ) 72 MCG capsule Take 1 capsule (72 mcg total) by mouth daily before breakfast. TAKE 1 CAPSULE BY MOUTH EVERY DAY IN THE MORNING (Patient not taking: Reported on 05/29/2024)   losartan (COZAAR) 25 MG tablet Take 25 mg by mouth  daily. (Patient not taking: Reported on 05/29/2024)   mirtazapine  (REMERON ) 15 MG tablet Take 1 tablet (15 mg total) by mouth at bedtime. (Patient not taking: Reported on 05/29/2024)   promethazine -dextromethorphan (PROMETHAZINE -DM) 6.25-15 MG/5ML syrup Take 5 mLs by mouth 4 (four) times daily as needed. (Patient not taking: Reported on 05/29/2024)   [DISCONTINUED] donepezil  (ARICEPT ) 5 MG tablet Take 1 tablet (5 mg total) by mouth at bedtime. (Patient not taking: Reported on 05/29/2024)   No facility-administered medications prior to visit.    Review of Systems  Constitutional: Negative.  Negative for weight loss (gained 3 lbs).  HENT: Negative.    Eyes: Negative.   Respiratory: Negative.    Cardiovascular: Negative.   Genitourinary: Negative.    Musculoskeletal:  Positive for joint pain.  Skin: Negative.   Neurological:  Negative for dizziness.  Endo/Heme/Allergies: Negative.        Objective:   BP 122/78   Pulse 84   Ht 5' 2 (1.575 m)   Wt 163 lb 6.4 oz (74.1 kg)   SpO2 99%   BMI 29.89 kg/m   Vitals:   05/29/24 1136  BP: 122/78  Pulse: 84  Height: 5' 2 (1.575 m)  Weight: 163 lb 6.4 oz (74.1 kg)  SpO2: 99%  BMI (Calculated): 29.88    Physical Exam Vitals reviewed.  Constitutional:      General: She is not in acute distress. HENT:     Head: Normocephalic.     Nose: Nose normal.     Mouth/Throat:     Mouth: Mucous membranes are moist.  Eyes:     Extraocular Movements: Extraocular movements intact.     Pupils: Pupils are equal, round, and reactive to light.  Cardiovascular:     Rate and Rhythm: Normal rate and regular rhythm.     Heart sounds: No murmur heard. Pulmonary:     Effort: Pulmonary effort is normal.     Breath sounds: No rhonchi or rales.  Abdominal:     General: Abdomen is flat.     Palpations: There is no hepatomegaly, splenomegaly or mass.  Musculoskeletal:        General: Normal range of motion.     Cervical back: Normal range of motion. No tenderness.  Skin:    General: Skin is warm and dry.  Neurological:     General: No focal deficit present.     Mental Status: She is alert and oriented to person, place, and time.     Cranial Nerves: No cranial nerve deficit.     Motor: No weakness.  Psychiatric:        Mood and Affect: Mood normal.        Behavior: Behavior normal.      No results found for any visits on 05/29/24.      Assessment & Plan:  Isaly was seen today for follow-up.  Mild dementia without behavioral disturbance, psychotic disturbance, mood disturbance, or anxiety, unspecified dementia type (HCC) -     Donepezil  HCl; Take 1 tablet (10 mg total) by mouth at bedtime.  Dispense: 30 tablet; Refill: 2  Seasonal allergic rhinitis due to pollen -      Azelastine  HCl; Place 1 spray into the nose daily.  Dispense: 30 mL; Refill: 2 -     Cetirizine  HCl; Take 1 tablet (10 mg total) by mouth daily.  Dispense: 30 tablet; Refill: 2 -     Benzonatate; Take 1 capsule (100 mg total) by mouth 3 (three) times daily as  needed for up to 10 days for cough.  Dispense: 30 capsule; Refill: 0  Mixed hyperlipidemia -     Lipid panel -     CK    Problem List Items Addressed This Visit       Respiratory   Seasonal allergic rhinitis due to pollen   Relevant Medications   Azelastine  HCl 137 MCG/SPRAY SOLN   cetirizine  (ZYRTEC  ALLERGY) 10 MG tablet   benzonatate (TESSALON PERLES) 100 MG capsule     Other   Mixed hyperlipidemia   Other Visit Diagnoses       Mild dementia without behavioral disturbance, psychotic disturbance, mood disturbance, or anxiety, unspecified dementia type (HCC)    -  Primary   Relevant Medications   donepezil  (ARICEPT ) 10 MG tablet       Return in about 6 weeks (around 07/10/2024) for fu with labs prior.   Total time spent: 20 minutes  Sherrill Cinderella Perry, MD  05/29/2024   This document may have been prepared by [facility] Voice Recognition software and as such may include unintentional dictation errors.

## 2024-06-04 ENCOUNTER — Ambulatory Visit: Admitting: Internal Medicine

## 2024-06-11 ENCOUNTER — Ambulatory Visit: Payer: Self-pay | Admitting: Internal Medicine

## 2024-06-11 ENCOUNTER — Other Ambulatory Visit: Payer: Self-pay | Admitting: Internal Medicine

## 2024-06-12 ENCOUNTER — Other Ambulatory Visit: Payer: Self-pay | Admitting: Internal Medicine

## 2024-06-12 DIAGNOSIS — F03A0 Unspecified dementia, mild, without behavioral disturbance, psychotic disturbance, mood disturbance, and anxiety: Secondary | ICD-10-CM

## 2024-06-17 ENCOUNTER — Other Ambulatory Visit: Payer: Self-pay | Admitting: Internal Medicine

## 2024-06-17 DIAGNOSIS — E782 Mixed hyperlipidemia: Secondary | ICD-10-CM

## 2024-06-17 DIAGNOSIS — J301 Allergic rhinitis due to pollen: Secondary | ICD-10-CM

## 2024-06-25 NOTE — Progress Notes (Signed)
   06/25/2024  Patient ID: Deborah Hurst, female   DOB: 05-17-1941, 83 y.o.   MRN: [id number]  Pharmacy Quality Measure Review  This patient is appearing on a report for being at risk of failing the adherence measure for hypertension (ACEi/ARB) medications this calendar year.   Medication: Amlodipine -Benazepril  Last fill date: 06/18/24 for 90 day supply  Insurance report was not up to date. No action needed at this time.   [name], PharmD Clinical Pharmacist [phone]

## 2024-07-09 ENCOUNTER — Ambulatory Visit: Admitting: Internal Medicine

## 2024-07-11 ENCOUNTER — Other Ambulatory Visit: Payer: Self-pay

## 2024-07-11 ENCOUNTER — Emergency Department (HOSPITAL_COMMUNITY)
Admission: EM | Admit: 2024-07-11 | Discharge: 2024-07-11 | Disposition: A | Discharge status: Home / Self Care | Attending: Emergency Medicine | Admitting: Emergency Medicine

## 2024-07-11 ENCOUNTER — Emergency Department (HOSPITAL_COMMUNITY)

## 2024-07-11 DIAGNOSIS — J181 Lobar pneumonia, unspecified organism: Secondary | ICD-10-CM | POA: Diagnosis not present

## 2024-07-11 DIAGNOSIS — D649 Anemia, unspecified: Secondary | ICD-10-CM | POA: Insufficient documentation

## 2024-07-11 DIAGNOSIS — Z79899 Other long term (current) drug therapy: Secondary | ICD-10-CM | POA: Insufficient documentation

## 2024-07-11 DIAGNOSIS — I10 Essential (primary) hypertension: Secondary | ICD-10-CM | POA: Diagnosis not present

## 2024-07-11 DIAGNOSIS — R944 Abnormal results of kidney function studies: Secondary | ICD-10-CM | POA: Insufficient documentation

## 2024-07-11 DIAGNOSIS — J189 Pneumonia, unspecified organism: Secondary | ICD-10-CM

## 2024-07-11 DIAGNOSIS — R059 Cough, unspecified: Secondary | ICD-10-CM | POA: Diagnosis present

## 2024-07-11 LAB — CBC
HCT: 33.5 % — ABNORMAL LOW (ref 36.0–46.0)
Hemoglobin: 11.2 g/dL — ABNORMAL LOW (ref 12.0–15.0)
MCH: 30.9 pg (ref 26.0–34.0)
MCHC: 33.4 g/dL (ref 30.0–36.0)
MCV: 92.3 fL (ref 80.0–100.0)
Platelets: 306 K/uL (ref 150–400)
RBC: 3.63 MIL/uL — ABNORMAL LOW (ref 3.87–5.11)
RDW: 12.8 % (ref 11.5–15.5)
WBC: 7.1 K/uL (ref 4.0–10.5)
nRBC: 0 % (ref 0.0–0.2)

## 2024-07-11 LAB — RESP PANEL BY RT-PCR (RSV, FLU A&B, COVID)  RVPGX2
Influenza A by PCR: NEGATIVE
Influenza B by PCR: NEGATIVE
Resp Syncytial Virus by PCR: NEGATIVE
SARS Coronavirus 2 by RT PCR: NEGATIVE

## 2024-07-11 LAB — TROPONIN I (HIGH SENSITIVITY)
Troponin I (High Sensitivity): 13 ng/L (ref <18)
Troponin I (High Sensitivity): 14 ng/L (ref <18)

## 2024-07-11 LAB — BASIC METABOLIC PANEL WITH GFR
Anion gap: 8 (ref 5–15)
BUN: 19 mg/dL (ref 8–23)
CO2: 27 mmol/L (ref 22–32)
Calcium: 9.1 mg/dL (ref 8.9–10.3)
Chloride: 104 mmol/L (ref 98–111)
Creatinine, Ser: 1.2 mg/dL — ABNORMAL HIGH (ref 0.44–1.00)
GFR, Estimated: 45 mL/min — ABNORMAL LOW (ref >60)
Glucose, Bld: 100 mg/dL — ABNORMAL HIGH (ref 70–99)
Potassium: 3.8 mmol/L (ref 3.5–5.1)
Sodium: 139 mmol/L (ref 135–145)

## 2024-07-11 MED ORDER — AMOXICILLIN-POT CLAVULANATE 875-125 MG PO TABS: 1.0000 | ORAL_TABLET | Freq: Two times a day (BID) | ORAL | 0 refills | Status: DC

## 2024-07-11 MED ORDER — ACETAMINOPHEN 500 MG PO TABS
1000.0000 mg | ORAL_TABLET | Freq: Once | ORAL | Status: AC
  Administered 2024-07-11: 1000 mg via ORAL
  Filled 2024-07-11: qty 2

## 2024-07-11 MED ORDER — AZITHROMYCIN 250 MG PO TABS: 250.0000 mg | ORAL_TABLET | Freq: Every day | ORAL | 0 refills | Status: AC

## 2024-07-11 MED ORDER — AZITHROMYCIN 250 MG PO TABS
500.0000 mg | ORAL_TABLET | Freq: Once | ORAL | Status: AC
  Administered 2024-07-11: 500 mg via ORAL
  Filled 2024-07-11: qty 2

## 2024-07-11 MED ORDER — AMOXICILLIN-POT CLAVULANATE 875-125 MG PO TABS
1.0000 | ORAL_TABLET | Freq: Once | ORAL | Status: AC
  Administered 2024-07-11: 1 via ORAL
  Filled 2024-07-11: qty 1

## 2024-07-11 NOTE — ED Triage Notes (Signed)
 Pt reports she was coughing all night with chest pain.

## 2024-07-11 NOTE — ED Notes (Signed)
 Pt take otf for xray wi

## 2024-07-11 NOTE — ED Provider Notes (Signed)
 [place] EMERGENCY DEPARTMENT AT [facility] Provider Note   CSN: [id number] Arrival date & time: 07/11/24  9168     Patient presents with: No chief complaint on file.   Deborah Hurst is a 83 y.o. female with PMHx GERD, HLD, HTN who presents to ED concerned for productive cough x2 weeks. Patient also recently endorsing some SOB and right sided chest pain that is reproducible to palpation. Denies fever, nausea, vomiting, diarrhea.   Denies recent surgery/immobilization, hx DVT/PE, hemoptysis, hx cancer in the past 6 months, calf swelling/tenderness. Denies weight gain or LE edema.    HPI     Prior to Admission medications   Medication Sig Start Date End Date Taking? Authorizing Provider  amoxicillin -clavulanate (AUGMENTIN ) 875-125 MG tablet Take 1 tablet by mouth every 12 (twelve) hours for 7 days. 07/11/24 07/18/24 Yes Hoy Fraction F, PA-C  azithromycin  (ZITHROMAX ) 250 MG tablet Take 1 tablet (250 mg total) by mouth daily for 4 days. Take 1 every day until finished. 07/11/24 07/15/24 Yes Shelbee Apgar, Fraction F, PA-C  amLODipine -benazepril  (LOTREL) 10-20 MG capsule Take 1 capsule by mouth daily. 02/05/24 08/03/24  Albina GORMAN Dine, MD  Azelastine  HCl 137 MCG/SPRAY SOLN Place 1 spray into the nose daily. 05/29/24 08/27/24  Albina GORMAN Dine, MD  benzonatate  (TESSALON ) 100 MG capsule TAKE 1 CAPSULE(100 MG) BY MOUTH THREE TIMES DAILY FOR UP TO 10 DAYS AS NEEDED FOR COUGH 06/17/24   Albina GORMAN Dine, MD  cetirizine  (ZYRTEC  ALLERGY) 10 MG tablet Take 1 tablet (10 mg total) by mouth daily. 05/29/24 08/27/24  Albina GORMAN Dine, MD  cloNIDine  (CATAPRES ) 0.1 MG tablet Take 1 tablet (0.1 mg total) by mouth at bedtime. Patient not taking: Reported on 05/29/2024 02/05/24 08/03/24  Albina GORMAN Dine, MD  donepezil  (ARICEPT ) 10 MG tablet Take 1 tablet (10 mg total) by mouth at bedtime. 05/29/24 05/29/25  Albina GORMAN Dine, MD  fluticasone (FLONASE) 50 MCG/ACT nasal spray      [provider]  linaclotide  (LINZESS ) 72 MCG capsule Take 1 capsule (72 mcg total) by mouth daily before breakfast. TAKE 1 CAPSULE BY MOUTH EVERY DAY IN THE MORNING 05/29/24 08/27/24  Albina GORMAN Dine, MD  mirtazapine  (REMERON ) 15 MG tablet Take 1 tablet (15 mg total) by mouth at bedtime. 02/05/24 08/03/24  Albina GORMAN Dine, MD  rosuvastatin  (CRESTOR ) 5 MG tablet TAKE 1 TABLET(5 MG) BY MOUTH AT BEDTIME 06/17/24   Albina GORMAN Dine, MD    Allergies: Patient has no known allergies.    Review of Systems  Cardiovascular:  Positive for chest pain.    Updated Vital Signs BP 130/78   Pulse 64   Temp 98 F (36.7 C)   Resp 12   Ht 5' 2 (1.575 m)   Wt 74.8 kg   SpO2 100%   BMI 30.18 kg/m   Physical Exam Vitals and nursing note reviewed.  Constitutional:      General: She is not in acute distress.    Appearance: She is not ill-appearing or toxic-appearing.  HENT:     Head: Normocephalic and atraumatic.     Mouth/Throat:     Mouth: Mucous membranes are moist.  Eyes:     General: No scleral icterus.       Right eye: No discharge.        Left eye: No discharge.     Conjunctiva/sclera: Conjunctivae normal.  Cardiovascular:     Rate and Rhythm: Normal rate and regular rhythm.     Pulses: Normal  pulses.     Heart sounds: Normal heart sounds. No murmur heard. Pulmonary:     Effort: Pulmonary effort is normal. No respiratory distress.     Breath sounds: Normal breath sounds. No rhonchi or rales.     Comments: Mild wheezing in left upper lobe Abdominal:     General: Abdomen is flat. Bowel sounds are normal.     Palpations: Abdomen is soft.  Musculoskeletal:     Right lower leg: No edema.     Left lower leg: No edema.     Comments: No calf tenderness or swelling appreciated  Skin:    General: Skin is warm and dry.     Findings: No rash.  Neurological:     General: No focal deficit present.     Mental Status: She is alert and oriented to person, place, and time.  Mental status is at baseline.  Psychiatric:        Mood and Affect: Mood normal.        Behavior: Behavior normal.     (all labs ordered are listed, but only abnormal results are displayed) Labs Reviewed  BASIC METABOLIC PANEL WITH GFR - Abnormal; Notable for the following components:      Result Value   Glucose, Bld 100 (*)    Creatinine, Ser 1.20 (*)    GFR, Estimated 45 (*)    All other components within normal limits  CBC - Abnormal; Notable for the following components:   RBC 3.63 (*)    Hemoglobin 11.2 (*)    HCT 33.5 (*)    All other components within normal limits  RESP PANEL BY RT-PCR (RSV, FLU A&B, COVID)  RVPGX2  TROPONIN I (HIGH SENSITIVITY)  TROPONIN I (HIGH SENSITIVITY)    EKG: EKG Interpretation Date/Time:  Thursday July 11 2024 08:46:51 EST Ventricular Rate:  66 PR Interval:  183 QRS Duration:  96 QT Interval:  428 QTC Calculation: 449 R Axis:   -32  Text Interpretation: Sinus rhythm Left axis deviation Borderline ST elevation, lateral leads Confirmed by Patsey Lot [phone]) on 07/11/2024 10:38:28 AM  Radiology: DG Chest 2 View Result Date: 07/11/2024 CLINICAL DATA:  Chest pain.  Cough. EXAM: CHEST - 2 VIEW COMPARISON:  02/02/2023 FINDINGS: The heart size and mediastinal contours are within normal limits. Both lungs are clear. Thoracic spine degenerative changes noted. IMPRESSION: No active cardiopulmonary disease. Electronically Signed   By: Norleen DELENA Kil M.D.   On: 07/11/2024 09:32     Procedures   Medications Ordered in the ED  amoxicillin -clavulanate (AUGMENTIN ) 875-125 MG per tablet 1 tablet (has no administration in time range)  azithromycin  (ZITHROMAX ) tablet 500 mg (has no administration in time range)  acetaminophen  (TYLENOL ) tablet 1,000 mg (1,000 mg Oral Given 07/11/24 1133)                                    Medical Decision Making Amount and/or Complexity of Data Reviewed Labs: ordered. Radiology: ordered.  Risk OTC  drugs.   This patient presents to the ED for concern of chest pain, this involves an extensive number of treatment options, and is a complaint that carries with it a high risk of complications and morbidity.  The differential diagnosis includes acute coronary syndrome, congestive heart failure, pericarditis, pneumonia, pulmonary embolism, tension pneumothorax, esophageal rupture, aortic dissection, cardiac tamponade, musculoskeletal   Co morbidities that complicate the patient evaluation  GERD, HLD, HTN  Additional history obtained:  Dr. Albina PCP   Risk Stratification Score:  HEART Score: 3 (low) Wells Score: 0   Problem List / ED Course / Critical interventions / Medication management  Patient presents ED concern for cough x 2 weeks and progressive SOB and right-sided chest pain that is reproducible to palpation.  Physical exam with focal adventitious lung sounds in the left upper field.  Rest of physical exam reassuring.  Patient afebrile with stable vitals. I Ordered, and personally interpreted labs.  Delta troponin within normal limits.  CBC without leukocytosis.  There is mild anemia with hemoglobin at 11.2.  BMP with chronic mild elevation in creatinine at 1.2 today.  Respiratory panel negative.   The patient was maintained on a cardiac monitor.  I personally viewed and interpreted the EKG/cardiac monitored which showed an underlying rhythm of: Sinus rhythm. I ordered imaging studies including chest xray to assess for process contributing to patient's symptoms. I independently visualized and interpreted imaging which showed no acute cardiopulmonary disease. I agree with the radiologist interpretation. Given adventitious lung sounds, productive cough, and worsening SOB, I am most concerned that patient's symptoms are due to a developing pneumonia.  Chest pain on the right side is reproducible to palpation - likely MSK and cough related.  Shared all results with patient.   Answered all questions.  Patient stated that she feels safe with discharge home and starting antibiotic course. Staffed with Dr. Patsey who agrees with plan. I have reviewed the patients home medicines and have made adjustments as needed The patient has been appropriately medically screened and/or stabilized in the ED. I have low suspicion for any other emergent medical condition which would require further screening, evaluation or treatment in the ED or require inpatient management. At time of discharge the patient is hemodynamically stable and in no acute distress. I have discussed work-up results and diagnosis with patient and answered all questions. Patient is agreeable with discharge plan. We discussed strict return precautions for returning to the emergency department and they verbalized understanding.     Social Determinants of Health:  geriatric      Final diagnoses:  Pneumonia of left upper lobe due to infectious organism    ED Discharge Orders          Ordered    amoxicillin -clavulanate (AUGMENTIN ) 875-125 MG tablet  Every 12 hours        07/11/24 1217    azithromycin  (ZITHROMAX ) 250 MG tablet  Daily        07/11/24 1217               Hoy Nidia FALCON, PA-C 07/11/24 1218    Patsey Lot, MD 07/12/24 [phone]

## 2024-07-11 NOTE — Discharge Instructions (Signed)
 Your repeat troponin was also normal.  Workup today is concerning for developing pneumonia.  I have prescribed antibiotics to a nearby pharmacy that is open 24/7.  Please follow-up with your primary care provider early next week.  Seek emergency care experiencing any new or worsening symptoms.

## 2024-07-11 NOTE — ED Notes (Signed)
 Paper work given and reviewed with pt and son. Pt is leaving home with son. Iv removed. Pt is in no new onset distress at this time. Education reviewed with pt.

## 2024-07-16 ENCOUNTER — Ambulatory Visit: Admitting: Internal Medicine

## 2024-07-16 VITALS — BP 114/68 | HR 74 | Temp 97.0°F | Ht 62.0 in | Wt 160.0 lb

## 2024-07-16 DIAGNOSIS — E782 Mixed hyperlipidemia: Secondary | ICD-10-CM

## 2024-07-16 DIAGNOSIS — J189 Pneumonia, unspecified organism: Secondary | ICD-10-CM

## 2024-07-16 NOTE — Progress Notes (Signed)
 Established Patient Office Visit  Subjective:  Patient ID: Deborah Hurst, female    DOB: [birth year]  Age: 83 y.o. MRN: [id number]  Chief Complaint  Patient presents with   Follow-up    6 weeks lab follow up    ER follow up for walking pneumonia. Still c/o cough productive of white sputum but denies shortness of breath or wheezing.    No other concerns at this time.   Past Medical History:  Diagnosis Date   GERD (gastroesophageal reflux disease)    Hyperlipidemia    Hypertension     Past Surgical History:  Procedure Laterality Date   ABDOMINAL HYSTERECTOMY     CHOLECYSTECTOMY     KNEE SURGERY Right     Social History   Socioeconomic History   Marital status: Married    Spouse name: Not on file   Number of children: Not on file   Years of education: Not on file   Highest education level: Not on file  Occupational History   Not on file  Tobacco Use   Smoking status: Never   Smokeless tobacco: Never  Vaping Use   Vaping status: Never Used  Substance and Sexual Activity   Alcohol use: Never   Drug use: Never   Sexual activity: Not on file  Other Topics Concern   Not on file  Social History Narrative   Not on file   Social Drivers of Health   Financial Resource Strain: Not on file  Food Insecurity: No Food Insecurity (02/02/2023)   Hunger Vital Sign    Worried About Running Out of Food in the Last Year: Never true    Ran Out of Food in the Last Year: Never true  Transportation Needs: No Transportation Needs (02/02/2023)   PRAPARE - Administrator, Civil Service (Medical): No    Lack of Transportation (Non-Medical): No  Physical Activity: Not on file  Stress: Not on file  Social Connections: Not on file  Intimate Partner Violence: Not At Risk (02/02/2023)   Humiliation, Afraid, Rape, and Kick questionnaire    Fear of Current or Ex-Partner: No    Emotionally Abused: No    Physically Abused: No    Sexually Abused: No    Family History   Problem Relation Age of Onset   Heart attack Mother 31   Prostate cancer Father    Breast cancer Neg Hx     No Known Allergies  Outpatient Medications Prior to Visit  Medication Sig   amLODipine -benazepril  (LOTREL) 10-20 MG capsule Take 1 capsule by mouth daily.   Azelastine  HCl 137 MCG/SPRAY SOLN Place 1 spray into the nose daily.   cetirizine  (ZYRTEC  ALLERGY) 10 MG tablet Take 1 tablet (10 mg total) by mouth daily.   donepezil  (ARICEPT ) 10 MG tablet Take 1 tablet (10 mg total) by mouth at bedtime.   fluticasone (FLONASE) 50 MCG/ACT nasal spray    linaclotide  (LINZESS ) 72 MCG capsule Take 1 capsule (72 mcg total) by mouth daily before breakfast. TAKE 1 CAPSULE BY MOUTH EVERY DAY IN THE MORNING   mirtazapine  (REMERON ) 15 MG tablet Take 1 tablet (15 mg total) by mouth at bedtime.   rosuvastatin  (CRESTOR ) 5 MG tablet TAKE 1 TABLET(5 MG) BY MOUTH AT BEDTIME   cloNIDine  (CATAPRES ) 0.1 MG tablet Take 1 tablet (0.1 mg total) by mouth at bedtime. (Patient not taking: Reported on 07/16/2024)   [DISCONTINUED] amoxicillin -clavulanate (AUGMENTIN ) 875-125 MG tablet Take 1 tablet by mouth every 12 (twelve) hours for  7 days.   [DISCONTINUED] benzonatate  (TESSALON ) 100 MG capsule TAKE 1 CAPSULE(100 MG) BY MOUTH THREE TIMES DAILY FOR UP TO 10 DAYS AS NEEDED FOR COUGH   No facility-administered medications prior to visit.    Review of Systems  Constitutional: Negative.  Negative for chills, fever and malaise/fatigue.  HENT: Negative.  Negative for congestion.   Eyes: Negative.   Respiratory:  Positive for cough and sputum production. Negative for hemoptysis, shortness of breath and wheezing.   Cardiovascular: Negative.   Genitourinary: Negative.   Musculoskeletal:  Positive for joint pain.  Skin: Negative.   Neurological:  Negative for dizziness.  Endo/Heme/Allergies: Negative.        Objective:   BP 114/68   Pulse 74   Temp (!) 97 F (36.1 C) (Tympanic)   Ht 5' 2 (1.575 m)   Wt 160  lb (72.6 kg)   SpO2 97%   BMI 29.26 kg/m   Vitals:   07/16/24 1144  BP: 114/68  Pulse: 74  Temp: (!) 97 F (36.1 C)  Height: 5' 2 (1.575 m)  Weight: 160 lb (72.6 kg)  SpO2: 97%  TempSrc: Tympanic  BMI (Calculated): 29.26    Physical Exam Vitals reviewed.  Constitutional:      General: She is not in acute distress. HENT:     Head: Normocephalic.     Nose: Nose normal.     Mouth/Throat:     Mouth: Mucous membranes are moist.  Eyes:     Extraocular Movements: Extraocular movements intact.     Pupils: Pupils are equal, round, and reactive to light.  Cardiovascular:     Rate and Rhythm: Normal rate and regular rhythm.     Heart sounds: No murmur heard. Pulmonary:     Effort: Pulmonary effort is normal.     Breath sounds: Examination of the left-upper field reveals decreased breath sounds. Examination of the left-lower field reveals rales. Decreased breath sounds and rales present. No rhonchi.  Abdominal:     General: Abdomen is flat.     Palpations: There is no hepatomegaly, splenomegaly or mass.  Musculoskeletal:        General: Normal range of motion.     Cervical back: Normal range of motion. No tenderness.  Skin:    General: Skin is warm and dry.  Neurological:     General: No focal deficit present.     Mental Status: She is alert and oriented to person, place, and time.     Cranial Nerves: No cranial nerve deficit.     Motor: No weakness.  Psychiatric:        Mood and Affect: Mood normal.        Behavior: Behavior normal.      No results found for any visits on 07/16/24.      Assessment & Plan:  Karlin was seen today for follow-up.  Pneumonia of left upper lobe due to infectious organism  Mixed hyperlipidemia -     Lipid panel    Problem List Items Addressed This Visit       Other   Mixed hyperlipidemia   Other Visit Diagnoses       Pneumonia of left upper lobe due to infectious organism    -  Primary       Return in about 3 weeks  (around 08/06/2024) for fu with labs prior.   Total time spent: 20 minutes. This time includes review of previous notes and results and patient face to face interaction during  today'Nichola Warren visit.    Sherrill Cinderella Perry, MD  07/16/2024   This document may have been prepared by [facility] Voice Recognition software and as such may include unintentional dictation errors.

## 2024-08-05 ENCOUNTER — Other Ambulatory Visit

## 2024-08-06 ENCOUNTER — Ambulatory Visit (INDEPENDENT_AMBULATORY_CARE_PROVIDER_SITE_OTHER): Admitting: Internal Medicine

## 2024-08-06 ENCOUNTER — Ambulatory Visit: Payer: Self-pay | Admitting: Internal Medicine

## 2024-08-06 DIAGNOSIS — I10 Essential (primary) hypertension: Secondary | ICD-10-CM

## 2024-08-06 DIAGNOSIS — N951 Menopausal and female climacteric states: Secondary | ICD-10-CM | POA: Diagnosis not present

## 2024-08-06 DIAGNOSIS — F3289 Other specified depressive episodes: Secondary | ICD-10-CM | POA: Diagnosis not present

## 2024-08-06 DIAGNOSIS — F03A0 Unspecified dementia, mild, without behavioral disturbance, psychotic disturbance, mood disturbance, and anxiety: Secondary | ICD-10-CM | POA: Diagnosis not present

## 2024-08-06 DIAGNOSIS — Z131 Encounter for screening for diabetes mellitus: Secondary | ICD-10-CM

## 2024-08-06 LAB — CK: Total CK: 84 U/L (ref 26–161)

## 2024-08-06 LAB — LIPID PANEL
Chol/HDL Ratio: 2.5 ratio (ref 0.0–4.4)
Cholesterol, Total: 125 mg/dL (ref 100–199)
HDL: 51 mg/dL
LDL Chol Calc (NIH): 58 mg/dL (ref 0–99)
Triglycerides: 78 mg/dL (ref 0–149)
VLDL Cholesterol Cal: 16 mg/dL (ref 5–40)

## 2024-08-06 MED ORDER — CLONIDINE HCL 0.1 MG PO TABS: 0.1000 mg | ORAL_TABLET | Freq: Every evening | ORAL | 1 refills | Status: AC

## 2024-08-06 MED ORDER — AMLODIPINE BESY-BENAZEPRIL HCL 10-20 MG PO CAPS: 1.0000 | ORAL_CAPSULE | Freq: Every day | ORAL | 1 refills | Status: AC

## 2024-08-06 MED ORDER — DONEPEZIL HCL 10 MG PO TABS: 10.0000 mg | ORAL_TABLET | Freq: Every day | ORAL | 1 refills | Status: AC

## 2024-08-06 MED ORDER — MIRTAZAPINE 15 MG PO TABS: 15.0000 mg | ORAL_TABLET | Freq: Every day | ORAL | 1 refills | Status: AC

## 2024-08-06 NOTE — Progress Notes (Signed)
 "  Established Patient Office Visit  Subjective:  Patient ID: Deborah Hurst, female    DOB: 11/22/1940  Age: 83 y.o. MRN: [id number]  Chief Complaint  Patient presents with   Follow-up    3 week lab follow up    No new complaints, here for lab review and medication refills. LDL and TC well controlled on lab review. Triglycerides also satisfactory. BP readings have been normal at home.      No other concerns at this time.   Past Medical History:  Diagnosis Date   GERD (gastroesophageal reflux disease)    Hyperlipidemia    Hypertension     Past Surgical History:  Procedure Laterality Date   ABDOMINAL HYSTERECTOMY     CHOLECYSTECTOMY     KNEE SURGERY Right     Social History   Socioeconomic History   Marital status: Married    Spouse name: Not on file   Number of children: Not on file   Years of education: Not on file   Highest education level: Not on file  Occupational History   Not on file  Tobacco Use   Smoking status: Never   Smokeless tobacco: Never  Vaping Use   Vaping status: Never Used  Substance and Sexual Activity   Alcohol use: Never   Drug use: Never   Sexual activity: Not on file  Other Topics Concern   Not on file  Social History Narrative   Not on file   Social Drivers of Health   Tobacco Use: Low Risk (05/29/2024)   Patient History    Smoking Tobacco Use: Never    Smokeless Tobacco Use: Never    Passive Exposure: Not on file  Financial Resource Strain: Not on file  Food Insecurity: No Food Insecurity (02/02/2023)   Hunger Vital Sign    Worried About Running Out of Food in the Last Year: Never true    Ran Out of Food in the Last Year: Never true  Transportation Needs: No Transportation Needs (02/02/2023)   PRAPARE - Administrator, Civil Service (Medical): No    Lack of Transportation (Non-Medical): No  Physical Activity: Not on file  Stress: Not on file  Social Connections: Not on file  Intimate Partner Violence:  Not At Risk (02/02/2023)   Humiliation, Afraid, Rape, and Kick questionnaire    Fear of Current or Ex-Partner: No    Emotionally Abused: No    Physically Abused: No    Sexually Abused: No  Depression (PHQ2-9): Low Risk (02/05/2024)   Depression (PHQ2-9)    PHQ-2 Score: 1  Alcohol Screen: Not on file  Housing: Low Risk (02/02/2023)   Housing    Last Housing Risk Score: 0  Utilities: At Risk (02/02/2023)   AHC Utilities    Threatened with loss of utilities: Yes  Health Literacy: Not on file    Family History  Problem Relation Age of Onset   Heart attack Mother 24   Prostate cancer Father    Breast cancer Neg Hx     Allergies[1]  Show/hide medication list[2]  Review of Systems  Constitutional: Negative.  Negative for chills, fever, malaise/fatigue and weight loss (gained 2 lbs).  HENT: Negative.  Negative for congestion.   Eyes: Negative.   Respiratory:  Negative for cough, hemoptysis, sputum production, shortness of breath and wheezing.   Cardiovascular: Negative.   Genitourinary: Negative.   Musculoskeletal:  Positive for joint pain.  Skin: Negative.   Neurological:  Negative for dizziness.  Endo/Heme/Allergies:  Negative.        Objective:   BP 126/68   Pulse 70   Temp 97.7 F (36.5 C)   Ht 5' 2 (1.575 m)   Wt 162 lb 3.2 oz (73.6 kg)   SpO2 98%   BMI 29.67 kg/m   Vitals:   08/06/24 1400  BP: 126/68  Pulse: 70  Temp: 97.7 F (36.5 C)  Height: 5' 2 (1.575 m)  Weight: 162 lb 3.2 oz (73.6 kg)  SpO2: 98%  BMI (Calculated): 29.66    Physical Exam Vitals reviewed.  Constitutional:      General: She is not in acute distress. HENT:     Head: Normocephalic.     Nose: Nose normal.     Mouth/Throat:     Mouth: Mucous membranes are moist.  Eyes:     Extraocular Movements: Extraocular movements intact.     Pupils: Pupils are equal, round, and reactive to light.  Cardiovascular:     Rate and Rhythm: Normal rate and regular rhythm.     Heart sounds: No  murmur heard. Pulmonary:     Effort: Pulmonary effort is normal.     Breath sounds: Examination of the left-upper field reveals decreased breath sounds. Examination of the left-lower field reveals rales. Decreased breath sounds and rales present. No rhonchi.  Abdominal:     General: Abdomen is flat.     Palpations: There is no hepatomegaly, splenomegaly or mass.  Musculoskeletal:        General: Normal range of motion.     Cervical back: Normal range of motion. No tenderness.  Skin:    General: Skin is warm and dry.  Neurological:     General: No focal deficit present.     Mental Status: She is alert and oriented to person, place, and time.     Cranial Nerves: No cranial nerve deficit.     Motor: No weakness.  Psychiatric:        Mood and Affect: Mood normal.        Behavior: Behavior normal.      No results found for any visits on 08/06/24.  Recent Results (from the past 2160 hours)  Resp panel by RT-PCR (RSV, Flu A&B, Covid) Anterior Nasal Swab     Status: None   Collection Time: 07/11/24  9:20 AM   Specimen: Anterior Nasal Swab  Result Value Ref Range   SARS Coronavirus 2 by RT PCR NEGATIVE NEGATIVE   Influenza A by PCR NEGATIVE NEGATIVE   Influenza B by PCR NEGATIVE NEGATIVE    Comment: (NOTE) The Xpert Xpress SARS-CoV-2/FLU/RSV plus assay is intended as an aid in the diagnosis of influenza from Nasopharyngeal swab specimens and should not be used as a sole basis for treatment. Nasal washings and aspirates are unacceptable for Xpert Xpress SARS-CoV-2/FLU/RSV testing.  Fact Sheet for Patients: bloggercourse.com  Fact Sheet for Healthcare Providers: seriousbroker.it  This test is not yet approved or cleared by the [place]  FDA and has been authorized for detection and/or diagnosis of SARS-CoV-2 by FDA under an Emergency Use Authorization (EUA). This EUA will remain in effect (meaning this test can be used) for  the duration of the COVID-19 declaration under Section 564(b)(1) of the Act, 21 U.[place].C. section 360bbb-3(b)(1), unless the authorization is terminated or revoked.     Resp Syncytial Virus by PCR NEGATIVE NEGATIVE    Comment: (NOTE) Fact Sheet for Patients: bloggercourse.com  Fact Sheet for Healthcare Providers: seriousbroker.it  This test is not yet  approved or cleared by the [place]  FDA and has been authorized for detection and/or diagnosis of SARS-CoV-2 by FDA under an Emergency Use Authorization (EUA). This EUA will remain in effect (meaning this test can be used) for the duration of the COVID-19 declaration under Section 564(b)(1) of the Act, 21 U.[place]oya Stallone.C. section 360bbb-3(b)(1), unless the authorization is terminated or revoked.  Performed at [facility], [address], [place], [place] [address]   Basic metabolic panel     Status: Abnormal   Collection Time: 07/11/24  9:31 AM  Result Value Ref Range   Sodium 139 135 - 145 mmol/L   Potassium 3.8 3.5 - 5.1 mmol/L   Chloride 104 98 - 111 mmol/L   CO2 27 22 - 32 mmol/L   Glucose, Bld 100 (H) 70 - 99 mg/dL    Comment: Glucose reference range applies only to samples taken after fasting for at least 8 hours.   BUN 19 8 - 23 mg/dL   Creatinine, Ser 8.79 (H) 0.44 - 1.00 mg/dL   Calcium  9.1 8.9 - 10.3 mg/dL   GFR, Estimated 45 (L) >60 mL/min    Comment: (NOTE) Calculated using the CKD-EPI Creatinine Equation (2021)    Anion gap 8 5 - 15    Comment: Performed at [facility], [address], [place], [place] [address]  CBC     Status: Abnormal   Collection Time: 07/11/24  9:31 AM  Result Value Ref Range   WBC 7.1 4.0 - 10.5 K/uL   RBC 3.63 (L) 3.87 - 5.11 MIL/uL   Hemoglobin 11.2 (L) 12.0 - 15.0 g/dL   HCT 66.4 (L) 63.9 - 53.9 %   MCV 92.3 80.0 - 100.0 fL   MCH 30.9 26.0 - 34.0 pg   MCHC 33.4 30.0 - 36.0 g/dL   RDW 87.1 88.4 - 84.4 %   Platelets 306 150 -  400 K/uL   nRBC 0.0 0.0 - 0.2 %    Comment: Performed at [facility], [address], [place], [place] [address]  Troponin I (High Sensitivity)     Status: None   Collection Time: 07/11/24  9:31 AM  Result Value Ref Range   Troponin I (High Sensitivity) 13 <18 ng/L    Comment: (NOTE) Elevated high sensitivity troponin I (hsTnI) values and significant  changes across serial measurements may suggest ACS but many other  chronic and acute conditions are known to elevate hsTnI results.  Refer to the Links section for chest pain algorithms and additional  guidance. Performed at [facility], [address] [place], [place] [address]   Troponin I (High Sensitivity)     Status: None   Collection Time: 07/11/24 11:27 AM  Result Value Ref Range   Troponin I (High Sensitivity) 14 <18 ng/L    Comment: (NOTE) Elevated high sensitivity troponin I (hsTnI) values and significant  changes across serial measurements may suggest ACS but many other  chronic and acute conditions are known to elevate hsTnI results.  Refer to the Links section for chest pain algorithms and additional  guidance. Performed at [facility], [address] [place], [place] [address]   Lipid panel     Status: None   Collection Time: 08/05/24  8:55 AM  Result Value Ref Range   Cholesterol, Total 125 100 - 199 mg/dL   Triglycerides 78 0 - 149 mg/dL   HDL 51 >60 mg/dL   VLDL Cholesterol Cal 16 5 -  40 mg/dL   LDL Chol Calc (NIH) 58 0 - 99 mg/dL   Chol/HDL Ratio 2.5 0.0 - 4.4 ratio    Comment:                                   T. Chol/HDL Ratio                                             Men  Women                               1/2 Avg.Risk  3.4    3.3                                   Avg.Risk  5.0    4.4                                2X Avg.Risk  9.6    7.1                                3X Avg.Risk 23.4   11.0   CK     Status: None   Collection Time: 08/05/24  8:56 AM  Result Value Ref  Range   Total CK 84 26 - 161 U/L      Assessment & Plan:  Deborah Hurst was seen today for follow-up.  Mild dementia without behavioral disturbance, psychotic disturbance, mood disturbance, or anxiety, unspecified dementia type (HCC) -     Donepezil  HCl; Take 1 tablet (10 mg total) by mouth at bedtime.  Dispense: 90 tablet; Refill: 1  Primary hypertension -     amLODIPine  Besy-Benazepril  HCl; Take 1 capsule by mouth daily.  Dispense: 90 capsule; Refill: 1  Other depression -     Mirtazapine ; Take 1 tablet (15 mg total) by mouth at bedtime.  Dispense: 90 tablet; Refill: 1  Hot flashes due to menopause -     cloNIDine  HCl; Take 1 tablet (0.1 mg total) by mouth at bedtime.  Dispense: 90 tablet; Refill: 1    Problem List Items Addressed This Visit       Cardiovascular and Mediastinum   Primary hypertension   Relevant Medications   amLODipine -benazepril  (LOTREL) 10-20 MG capsule   cloNIDine  (CATAPRES ) 0.1 MG tablet   Hot flashes due to menopause   Relevant Medications   amLODipine -benazepril  (LOTREL) 10-20 MG capsule   cloNIDine  (CATAPRES ) 0.1 MG tablet     Other   Depression   Relevant Medications   mirtazapine  (REMERON ) 15 MG tablet   Other Visit Diagnoses       Mild dementia without behavioral disturbance, psychotic disturbance, mood disturbance, or anxiety, unspecified dementia type (HCC)       Relevant Medications   donepezil  (ARICEPT ) 10 MG tablet   mirtazapine  (REMERON ) 15 MG tablet       Return in about 3 months (around 11/04/2024) for fu with labs prior.   Total time spent: 20 minutes. This time includes review of previous notes and results and patient face to face interaction  during today'Deborah Hurst visit.    Sherrill Cinderella Perry, MD  08/06/2024   This document may have been prepared by [facility] Voice Recognition software and as such may include unintentional dictation errors.     [1] No Known Allergies [2]  Outpatient Medications Prior to Visit  Medication Sig    Azelastine  HCl 137 MCG/SPRAY SOLN Place 1 spray into the nose daily.   cetirizine  (ZYRTEC  ALLERGY) 10 MG tablet Take 1 tablet (10 mg total) by mouth daily.   fluticasone (FLONASE) 50 MCG/ACT nasal spray    linaclotide  (LINZESS ) 72 MCG capsule Take 1 capsule (72 mcg total) by mouth daily before breakfast. TAKE 1 CAPSULE BY MOUTH EVERY DAY IN THE MORNING   rosuvastatin  (CRESTOR ) 5 MG tablet TAKE 1 TABLET(5 MG) BY MOUTH AT BEDTIME   [DISCONTINUED] amLODipine -benazepril  (LOTREL) 10-20 MG capsule Take 1 capsule by mouth daily.   [DISCONTINUED] donepezil  (ARICEPT ) 10 MG tablet Take 1 tablet (10 mg total) by mouth at bedtime.   [DISCONTINUED] mirtazapine  (REMERON ) 15 MG tablet Take 1 tablet (15 mg total) by mouth at bedtime.   [DISCONTINUED] cloNIDine  (CATAPRES ) 0.1 MG tablet Take 1 tablet (0.1 mg total) by mouth at bedtime. (Patient not taking: Reported on 08/06/2024)   No facility-administered medications prior to visit.   "

## 2024-08-14 ENCOUNTER — Other Ambulatory Visit: Payer: Self-pay

## 2024-08-14 ENCOUNTER — Emergency Department (HOSPITAL_COMMUNITY)
Admission: EM | Admit: 2024-08-14 | Discharge: 2024-08-14 | Discharge status: Against Medical Advice | Attending: Emergency Medicine | Admitting: Emergency Medicine

## 2024-08-14 ENCOUNTER — Emergency Department (HOSPITAL_COMMUNITY)

## 2024-08-14 DIAGNOSIS — Z5321 Procedure and treatment not carried out due to patient leaving prior to being seen by health care provider: Secondary | ICD-10-CM | POA: Diagnosis not present

## 2024-08-14 DIAGNOSIS — R093 Abnormal sputum: Secondary | ICD-10-CM | POA: Diagnosis not present

## 2024-08-14 DIAGNOSIS — R059 Cough, unspecified: Secondary | ICD-10-CM | POA: Diagnosis present

## 2024-08-14 LAB — RESP PANEL BY RT-PCR (RSV, FLU A&B, COVID)  RVPGX2
Influenza A by PCR: NEGATIVE
Influenza B by PCR: NEGATIVE
Resp Syncytial Virus by PCR: NEGATIVE
SARS Coronavirus 2 by RT PCR: NEGATIVE

## 2024-08-14 NOTE — ED Triage Notes (Signed)
 Pt is here with productive cough for 3 months, reports white sputum, denies sob, not sure about fever. No swelling to LE or history of HF per patient.

## 2024-08-14 NOTE — ED Notes (Addendum)
 Pt has left. Ambulated without distress with family member. Rn attempted to convince he to stay.

## 2024-08-18 ENCOUNTER — Emergency Department
Admission: EM | Admit: 2024-08-18 | Discharge: 2024-08-18 | Disposition: A | Discharge status: Home / Self Care | Attending: Emergency Medicine | Admitting: Emergency Medicine

## 2024-08-18 DIAGNOSIS — I10 Essential (primary) hypertension: Secondary | ICD-10-CM | POA: Diagnosis not present

## 2024-08-18 DIAGNOSIS — R55 Syncope and collapse: Secondary | ICD-10-CM | POA: Diagnosis present

## 2024-08-18 DIAGNOSIS — I959 Hypotension, unspecified: Secondary | ICD-10-CM | POA: Insufficient documentation

## 2024-08-18 DIAGNOSIS — E860 Dehydration: Secondary | ICD-10-CM | POA: Diagnosis not present

## 2024-08-18 DIAGNOSIS — R197 Diarrhea, unspecified: Secondary | ICD-10-CM | POA: Diagnosis not present

## 2024-08-18 DIAGNOSIS — I951 Orthostatic hypotension: Secondary | ICD-10-CM

## 2024-08-18 LAB — COMPREHENSIVE METABOLIC PANEL WITH GFR
ALT: 16 U/L (ref 0–44)
AST: 22 U/L (ref 15–41)
Albumin: 4.5 g/dL (ref 3.5–5.0)
Alkaline Phosphatase: 65 U/L (ref 38–126)
Anion gap: 11 (ref 5–15)
BUN: 20 mg/dL (ref 8–23)
CO2: 24 mmol/L (ref 22–32)
Calcium: 9.5 mg/dL (ref 8.9–10.3)
Chloride: 105 mmol/L (ref 98–111)
Creatinine, Ser: 1.09 mg/dL — ABNORMAL HIGH (ref 0.44–1.00)
GFR, Estimated: 50 mL/min — ABNORMAL LOW
Glucose, Bld: 105 mg/dL — ABNORMAL HIGH (ref 70–99)
Potassium: 3.5 mmol/L (ref 3.5–5.1)
Sodium: 140 mmol/L (ref 135–145)
Total Bilirubin: 0.3 mg/dL (ref 0.0–1.2)
Total Protein: 7.1 g/dL (ref 6.5–8.1)

## 2024-08-18 LAB — CBC
HCT: 35.2 % — ABNORMAL LOW (ref 36.0–46.0)
Hemoglobin: 11.9 g/dL — ABNORMAL LOW (ref 12.0–15.0)
MCH: 30.8 pg (ref 26.0–34.0)
MCHC: 33.8 g/dL (ref 30.0–36.0)
MCV: 91.2 fL (ref 80.0–100.0)
Platelets: 319 K/uL (ref 150–400)
RBC: 3.86 MIL/uL — ABNORMAL LOW (ref 3.87–5.11)
RDW: 12.5 % (ref 11.5–15.5)
WBC: 8.5 K/uL (ref 4.0–10.5)
nRBC: 0 % (ref 0.0–0.2)

## 2024-08-18 LAB — CBG MONITORING, ED: Glucose-Capillary: 101 mg/dL — ABNORMAL HIGH (ref 70–99)

## 2024-08-18 MED ORDER — SODIUM CHLORIDE 0.9 % IV BOLUS
1000.0000 mL | Freq: Once | INTRAVENOUS | Status: AC
  Administered 2024-08-18: 1000 mL via INTRAVENOUS

## 2024-08-18 NOTE — ED Provider Notes (Signed)
 "  [facility] Provider Note   Event Date/Time   First MD Initiated Contact with Patient 08/18/24 1346     (approximate) History  Loss of Consciousness  HPI [name] is a 84 y.o. female with a past medical history of hypertension and hyperlipidemia who presents complaining of diarrhea over the last 3 days as well as a syncopal episode today after getting up from a seated position attempting to go to the bathroom.  Patient states that she she felt some warm feeling and lightheadedness prior to waking up on the floor.  Patient's son found her and states that she returned to baseline quickly after his arrival.  Patient states that she has had diarrhea over the last 3 days after being on the Linzess  as she states she is normally constipated.  Patient denies any recent travel, sick contacts, or food out of the ordinary ROS: Patient currently denies any vision changes, tinnitus, difficulty speaking, facial droop, sore throat, chest pain, shortness of breath, abdominal pain, nausea/vomiting, dysuria, or weakness/numbness/paresthesias in any extremity   Physical Exam  Triage Vital Signs: ED Triage Vitals [08/18/24 1157]  Encounter Vitals Group     BP (!) 141/78     Girls Systolic BP Percentile      Girls Diastolic BP Percentile      Boys Systolic BP Percentile      Boys Diastolic BP Percentile      Pulse Rate 73     Resp 18     Temp 97.9 F (36.6 C)     Temp Source Oral     SpO2 99 %     Weight 160 lb 15 oz (73 kg)     Height 5' 2 (1.575 m)     Head Circumference      Peak Flow      Pain Score 0     Pain Loc      Pain Education      Exclude from Growth Chart    Most recent vital signs: Vitals:   08/18/24 1157  BP: (!) 141/78  Pulse: 73  Resp: 18  Temp: 97.9 F (36.6 C)  SpO2: 99%   General: Awake, oriented x4. CV:  Good peripheral perfusion. Resp:  Normal effort. Abd:  No distention. Other:  Elderly overweight African-American female resting  comfortably in no acute distress.  No appreciable head injury or any tenderness to palpation along the scalp or neck ED Results / Procedures / Treatments  Labs (all labs ordered are listed, but only abnormal results are displayed) Labs Reviewed  COMPREHENSIVE METABOLIC PANEL WITH GFR - Abnormal; Notable for the following components:      Result Value   Glucose, Bld 105 (*)    Creatinine, Ser 1.09 (*)    GFR, Estimated 50 (*)    All other components within normal limits  CBC - Abnormal; Notable for the following components:   RBC 3.86 (*)    Hemoglobin 11.9 (*)    HCT 35.2 (*)    All other components within normal limits  CBG MONITORING, ED - Abnormal; Notable for the following components:   Glucose-Capillary 101 (*)    All other components within normal limits  URINALYSIS, ROUTINE W REFLEX MICROSCOPIC   EKG ED ECG REPORT I, Artist MARLA Kerns, the attending physician, personally viewed and interpreted this ECG. Date: 08/18/2024 EKG Time: 1201 Rate: 71 Rhythm: normal sinus rhythm QRS Axis: normal Intervals: normal ST/T Wave abnormalities: normal Narrative Interpretation: no evidence of  acute ischemia PROCEDURES: Critical Care performed: No Procedures MEDICATIONS ORDERED IN ED: Medications  sodium chloride  0.9 % bolus 1,000 mL (1,000 mLs Intravenous New Bag/Given 08/18/24 1408)   IMPRESSION / MDM / ASSESSMENT AND PLAN / ED COURSE  I reviewed the triage vital signs and the nursing notes.                             The patient is on the cardiac monitor to evaluate for evidence of arrhythmia and/or significant heart rate changes. Patient's presentation is most consistent with acute presentation with potential threat to life or bodily function. Patient is an 84 year old female with the above-stated past medical history presents complaining of a syncopal episode that that occurred just prior to arrival in the setting of 3 days of diarrhea DDx: ACS, arrhythmia, orthostatic syncope,  vasovagal syncope Plan: CBC, CMP, EKG  Laboratory evaluation does not show any significant red flag abnormalities.  Patient is feeling much better after IV fluid administration and no longer feels orthostatic lightheadedness.  Patient is stable for discharge at this time with outpatient follow-up with her PCP.  Patient agrees with this plan and all questions were answered prior to discharge.  Patient given strict return precautions  Dispo: Discharge home with PCP follow-up   FINAL CLINICAL IMPRESSION(S) / ED DIAGNOSES   Final diagnoses:  Dehydration  Diarrhea, unspecified type  Syncope due to orthostatic hypotension   Rx / DC Orders   ED Discharge Orders     None      Note:  This document was prepared using Dragon voice recognition software and may include unintentional dictation errors.   Jossie Artist POUR, MD 08/18/24 1514  "

## 2024-08-18 NOTE — Discharge Instructions (Addendum)
 Please hold your Linzess  for any continued diarrhea

## 2024-08-18 NOTE — ED Triage Notes (Signed)
 Pt presents the ED via [facility] from home. EMS was called out for a syncopal episode. Pt started having diarrhea this morning. Pt had an episode of LOC when going to the bathroom and woke up on the floor. Pt denies use of a blood thinner. A&Ox4.  CBG 178

## 2024-11-11 ENCOUNTER — Ambulatory Visit: Admitting: Internal Medicine
